# Patient Record
Sex: Female | Born: 1955 | Race: White | Hispanic: No | Marital: Married | Smoking: Never smoker
Health system: Southern US, Community
[De-identification: ages and names within clinical notes are randomized; demographics above are authoritative.]

## PROBLEM LIST (undated history)

## (undated) DIAGNOSIS — E785 Hyperlipidemia, unspecified: Secondary | ICD-10-CM

## (undated) DIAGNOSIS — I7300 Raynaud's syndrome without gangrene: Secondary | ICD-10-CM

## (undated) DIAGNOSIS — L930 Discoid lupus erythematosus: Secondary | ICD-10-CM

## (undated) DIAGNOSIS — T7840XA Allergy, unspecified, initial encounter: Secondary | ICD-10-CM

## (undated) DIAGNOSIS — K648 Other hemorrhoids: Secondary | ICD-10-CM

## (undated) DIAGNOSIS — C4402 Squamous cell carcinoma of skin of lip: Secondary | ICD-10-CM

## (undated) DIAGNOSIS — K529 Noninfective gastroenteritis and colitis, unspecified: Secondary | ICD-10-CM

## (undated) DIAGNOSIS — Z9889 Other specified postprocedural states: Secondary | ICD-10-CM

## (undated) DIAGNOSIS — M1990 Unspecified osteoarthritis, unspecified site: Secondary | ICD-10-CM

## (undated) DIAGNOSIS — R112 Nausea with vomiting, unspecified: Secondary | ICD-10-CM

## (undated) DIAGNOSIS — K219 Gastro-esophageal reflux disease without esophagitis: Secondary | ICD-10-CM

## (undated) DIAGNOSIS — E039 Hypothyroidism, unspecified: Secondary | ICD-10-CM

## (undated) DIAGNOSIS — I351 Nonrheumatic aortic (valve) insufficiency: Secondary | ICD-10-CM

## (undated) DIAGNOSIS — M51369 Other intervertebral disc degeneration, lumbar region without mention of lumbar back pain or lower extremity pain: Secondary | ICD-10-CM

## (undated) DIAGNOSIS — J309 Allergic rhinitis, unspecified: Secondary | ICD-10-CM

## (undated) DIAGNOSIS — D860 Sarcoidosis of lung: Secondary | ICD-10-CM

## (undated) DIAGNOSIS — M5136 Other intervertebral disc degeneration, lumbar region: Secondary | ICD-10-CM

## (undated) HISTORY — PX: MEDIASTINOSCOPY: SUR861

## (undated) HISTORY — DX: Other specified postprocedural states: R11.2

## (undated) HISTORY — DX: Allergy, unspecified, initial encounter: T78.40XA

## (undated) HISTORY — DX: Raynaud's syndrome without gangrene: I73.00

## (undated) HISTORY — DX: Other specified postprocedural states: Z98.890

## (undated) HISTORY — DX: Other hemorrhoids: K64.8

## (undated) HISTORY — DX: Other intervertebral disc degeneration, lumbar region without mention of lumbar back pain or lower extremity pain: M51.369

## (undated) HISTORY — PX: TONSILLECTOMY: SUR1361

## (undated) HISTORY — DX: Hypothyroidism, unspecified: E03.9

## (undated) HISTORY — DX: Hyperlipidemia, unspecified: E78.5

## (undated) HISTORY — DX: Noninfective gastroenteritis and colitis, unspecified: K52.9

## (undated) HISTORY — DX: Unspecified osteoarthritis, unspecified site: M19.90

## (undated) HISTORY — DX: Discoid lupus erythematosus: L93.0

## (undated) HISTORY — DX: Squamous cell carcinoma of skin of lip: C44.02

## (undated) HISTORY — DX: Other intervertebral disc degeneration, lumbar region: M51.36

## (undated) HISTORY — DX: Nonrheumatic aortic (valve) insufficiency: I35.1

## (undated) HISTORY — PX: TONSILLECTOMY AND ADENOIDECTOMY: SHX28

## (undated) HISTORY — DX: Allergic rhinitis, unspecified: J30.9

## (undated) HISTORY — DX: Gastro-esophageal reflux disease without esophagitis: K21.9

## (undated) HISTORY — DX: Sarcoidosis of lung: D86.0

## (undated) HISTORY — PX: UPPER GASTROINTESTINAL ENDOSCOPY: SHX188

---

## 2004-06-03 ENCOUNTER — Encounter: Payer: Self-pay | Admitting: Internal Medicine

## 2004-06-03 ENCOUNTER — Ambulatory Visit: Payer: Self-pay | Admitting: Internal Medicine

## 2004-06-11 ENCOUNTER — Encounter: Payer: Self-pay | Admitting: Internal Medicine

## 2004-07-03 ENCOUNTER — Ambulatory Visit: Payer: Self-pay | Admitting: Gastroenterology

## 2004-07-03 ENCOUNTER — Encounter: Payer: Self-pay | Admitting: Internal Medicine

## 2004-08-13 ENCOUNTER — Encounter: Payer: Self-pay | Admitting: Internal Medicine

## 2004-11-05 ENCOUNTER — Encounter: Payer: Self-pay | Admitting: Internal Medicine

## 2004-11-11 ENCOUNTER — Ambulatory Visit: Payer: Self-pay | Admitting: Gastroenterology

## 2004-11-11 ENCOUNTER — Encounter: Payer: Self-pay | Admitting: Internal Medicine

## 2004-11-25 ENCOUNTER — Encounter: Payer: Self-pay | Admitting: Internal Medicine

## 2005-03-24 ENCOUNTER — Ambulatory Visit: Payer: Self-pay | Admitting: Obstetrics and Gynecology

## 2005-04-13 HISTORY — PX: OTHER SURGICAL HISTORY: SHX169

## 2005-07-08 ENCOUNTER — Encounter: Payer: Self-pay | Admitting: Cardiovascular Disease

## 2005-07-08 ENCOUNTER — Encounter: Payer: Self-pay | Admitting: Family Medicine

## 2006-03-06 ENCOUNTER — Emergency Department: Payer: Self-pay | Admitting: Emergency Medicine

## 2006-04-02 ENCOUNTER — Emergency Department: Payer: Self-pay | Admitting: Emergency Medicine

## 2006-04-02 ENCOUNTER — Ambulatory Visit: Payer: Self-pay | Admitting: Obstetrics and Gynecology

## 2006-04-13 HISTORY — PX: OOPHORECTOMY: SHX86

## 2006-04-14 ENCOUNTER — Encounter: Payer: Self-pay | Admitting: Internal Medicine

## 2006-04-16 ENCOUNTER — Ambulatory Visit: Payer: Self-pay | Admitting: Internal Medicine

## 2006-04-16 ENCOUNTER — Encounter: Payer: Self-pay | Admitting: Internal Medicine

## 2006-05-26 ENCOUNTER — Ambulatory Visit: Payer: Self-pay | Admitting: Otolaryngology

## 2006-06-02 ENCOUNTER — Ambulatory Visit: Payer: Self-pay | Admitting: Gastroenterology

## 2006-06-02 ENCOUNTER — Encounter: Payer: Self-pay | Admitting: Internal Medicine

## 2006-06-08 ENCOUNTER — Ambulatory Visit: Payer: Self-pay | Admitting: Obstetrics and Gynecology

## 2006-06-29 ENCOUNTER — Ambulatory Visit: Payer: Self-pay | Admitting: Surgery

## 2006-08-23 ENCOUNTER — Encounter: Payer: Self-pay | Admitting: Cardiovascular Disease

## 2006-12-03 ENCOUNTER — Ambulatory Visit: Payer: Self-pay | Admitting: Obstetrics and Gynecology

## 2006-12-10 ENCOUNTER — Ambulatory Visit: Payer: Self-pay | Admitting: Obstetrics and Gynecology

## 2007-03-14 HISTORY — PX: US ECHOCARDIOGRAPHY: HXRAD669

## 2007-03-18 ENCOUNTER — Encounter: Payer: Self-pay | Admitting: Cardiovascular Disease

## 2007-12-13 LAB — HM MAMMOGRAPHY: HM Mammogram: NORMAL

## 2007-12-15 ENCOUNTER — Ambulatory Visit: Payer: Self-pay | Admitting: Obstetrics and Gynecology

## 2008-04-17 ENCOUNTER — Encounter: Payer: Self-pay | Admitting: Family Medicine

## 2008-04-17 ENCOUNTER — Encounter: Payer: Self-pay | Admitting: Cardiovascular Disease

## 2008-04-17 ENCOUNTER — Ambulatory Visit: Payer: Self-pay | Admitting: Obstetrics and Gynecology

## 2008-05-22 ENCOUNTER — Encounter: Payer: Self-pay | Admitting: Internal Medicine

## 2008-06-12 LAB — HM PAP SMEAR

## 2008-06-12 LAB — CONVERTED CEMR LAB: Pap Smear: NORMAL

## 2008-06-26 ENCOUNTER — Encounter: Payer: Self-pay | Admitting: Cardiovascular Disease

## 2008-06-28 ENCOUNTER — Emergency Department: Payer: Self-pay | Admitting: Emergency Medicine

## 2008-06-28 ENCOUNTER — Encounter: Payer: Self-pay | Admitting: Family Medicine

## 2008-06-29 ENCOUNTER — Ambulatory Visit: Payer: Self-pay | Admitting: Family Medicine

## 2008-06-29 DIAGNOSIS — I359 Nonrheumatic aortic valve disorder, unspecified: Secondary | ICD-10-CM | POA: Insufficient documentation

## 2008-06-29 DIAGNOSIS — R0609 Other forms of dyspnea: Secondary | ICD-10-CM | POA: Insufficient documentation

## 2008-06-29 DIAGNOSIS — R079 Chest pain, unspecified: Secondary | ICD-10-CM | POA: Insufficient documentation

## 2008-06-29 DIAGNOSIS — R5383 Other fatigue: Secondary | ICD-10-CM

## 2008-06-29 DIAGNOSIS — R0989 Other specified symptoms and signs involving the circulatory and respiratory systems: Secondary | ICD-10-CM

## 2008-06-29 DIAGNOSIS — E785 Hyperlipidemia, unspecified: Secondary | ICD-10-CM

## 2008-06-29 DIAGNOSIS — R5381 Other malaise: Secondary | ICD-10-CM | POA: Insufficient documentation

## 2008-07-02 DIAGNOSIS — I10 Essential (primary) hypertension: Secondary | ICD-10-CM | POA: Insufficient documentation

## 2008-07-02 DIAGNOSIS — J309 Allergic rhinitis, unspecified: Secondary | ICD-10-CM | POA: Insufficient documentation

## 2008-07-02 DIAGNOSIS — K219 Gastro-esophageal reflux disease without esophagitis: Secondary | ICD-10-CM | POA: Insufficient documentation

## 2008-07-04 LAB — CONVERTED CEMR LAB
ALT: 22 units/L (ref 0–35)
AST: 30 units/L (ref 0–37)
Albumin: 3.9 g/dL (ref 3.5–5.2)
HDL: 53.1 mg/dL (ref >39.00)
Sed Rate: 17 mm/hr (ref 0–22)
Total Bilirubin: 0.9 mg/dL (ref 0.3–1.2)
Triglycerides: 57 mg/dL (ref 0.0–149.0)

## 2008-07-05 ENCOUNTER — Encounter: Payer: Self-pay | Admitting: Cardiovascular Disease

## 2008-07-05 ENCOUNTER — Encounter: Payer: Self-pay | Admitting: Family Medicine

## 2008-07-09 ENCOUNTER — Encounter: Payer: Self-pay | Admitting: Family Medicine

## 2008-07-09 ENCOUNTER — Encounter: Payer: Self-pay | Admitting: Cardiovascular Disease

## 2008-07-20 ENCOUNTER — Encounter: Payer: Self-pay | Admitting: Cardiovascular Disease

## 2008-07-20 ENCOUNTER — Encounter: Payer: Self-pay | Admitting: Family Medicine

## 2008-07-31 ENCOUNTER — Ambulatory Visit: Payer: Self-pay | Admitting: Family Medicine

## 2008-07-31 DIAGNOSIS — L930 Discoid lupus erythematosus: Secondary | ICD-10-CM

## 2008-07-31 DIAGNOSIS — R209 Unspecified disturbances of skin sensation: Secondary | ICD-10-CM

## 2008-07-31 DIAGNOSIS — D869 Sarcoidosis, unspecified: Secondary | ICD-10-CM | POA: Insufficient documentation

## 2008-07-31 DIAGNOSIS — I7300 Raynaud's syndrome without gangrene: Secondary | ICD-10-CM

## 2008-07-31 LAB — CONVERTED CEMR LAB
CRP, High Sensitivity: <1 (ref 0.00–5.00)
Rhuematoid fact SerPl-aCnc: <20 intl units/mL (ref 0.0–20.0)

## 2008-08-02 LAB — CONVERTED CEMR LAB: Anti Nuclear Antibody(ANA): NEGATIVE

## 2008-08-07 ENCOUNTER — Telehealth: Payer: Self-pay | Admitting: Family Medicine

## 2008-08-31 ENCOUNTER — Encounter: Payer: Self-pay | Admitting: Family Medicine

## 2008-09-25 ENCOUNTER — Encounter: Payer: Self-pay | Admitting: Family Medicine

## 2008-11-23 ENCOUNTER — Encounter: Payer: Self-pay | Admitting: Cardiovascular Disease

## 2008-12-05 ENCOUNTER — Encounter: Payer: Self-pay | Admitting: Family Medicine

## 2008-12-25 ENCOUNTER — Ambulatory Visit: Payer: Self-pay | Admitting: Family Medicine

## 2009-01-03 LAB — CONVERTED CEMR LAB: Ferritin: 22.5 ng/mL (ref 10.0–291.0)

## 2009-02-19 ENCOUNTER — Ambulatory Visit: Payer: Self-pay | Admitting: Internal Medicine

## 2009-02-19 DIAGNOSIS — R197 Diarrhea, unspecified: Secondary | ICD-10-CM

## 2009-02-19 DIAGNOSIS — R109 Unspecified abdominal pain: Secondary | ICD-10-CM | POA: Insufficient documentation

## 2009-03-22 ENCOUNTER — Ambulatory Visit: Payer: Self-pay | Admitting: Internal Medicine

## 2009-03-22 LAB — HM COLONOSCOPY

## 2009-03-27 ENCOUNTER — Telehealth: Payer: Self-pay | Admitting: Internal Medicine

## 2009-03-27 DIAGNOSIS — K37 Unspecified appendicitis: Secondary | ICD-10-CM | POA: Insufficient documentation

## 2009-03-29 ENCOUNTER — Ambulatory Visit: Payer: Self-pay | Admitting: Cardiovascular Disease

## 2009-04-01 ENCOUNTER — Telehealth: Payer: Self-pay | Admitting: Internal Medicine

## 2009-04-01 HISTORY — PX: COLONOSCOPY W/ BIOPSIES: SHX1374

## 2009-04-02 ENCOUNTER — Encounter: Payer: Self-pay | Admitting: Internal Medicine

## 2009-04-03 ENCOUNTER — Telehealth: Payer: Self-pay | Admitting: Internal Medicine

## 2009-04-04 ENCOUNTER — Encounter: Payer: Self-pay | Admitting: Internal Medicine

## 2009-04-17 ENCOUNTER — Encounter: Payer: Self-pay | Admitting: Internal Medicine

## 2009-04-19 ENCOUNTER — Encounter: Payer: Self-pay | Admitting: Internal Medicine

## 2009-06-07 ENCOUNTER — Encounter: Payer: Self-pay | Admitting: Cardiovascular Disease

## 2009-06-14 ENCOUNTER — Encounter: Payer: Self-pay | Admitting: Cardiovascular Disease

## 2009-07-02 ENCOUNTER — Encounter: Payer: Self-pay | Admitting: Family Medicine

## 2009-07-22 ENCOUNTER — Encounter: Payer: Self-pay | Admitting: Family Medicine

## 2009-07-26 ENCOUNTER — Encounter: Payer: Self-pay | Admitting: Internal Medicine

## 2009-09-20 ENCOUNTER — Ambulatory Visit (HOSPITAL_COMMUNITY): Admission: RE | Admit: 2009-09-20 | Discharge: 2009-09-21 | Payer: Self-pay | Admitting: General Surgery

## 2009-09-20 ENCOUNTER — Encounter (INDEPENDENT_AMBULATORY_CARE_PROVIDER_SITE_OTHER): Payer: Self-pay | Admitting: General Surgery

## 2009-09-20 HISTORY — PX: APPENDECTOMY: SHX54

## 2009-11-12 ENCOUNTER — Encounter: Payer: Self-pay | Admitting: Family Medicine

## 2010-03-21 ENCOUNTER — Ambulatory Visit: Payer: Self-pay | Admitting: Family Medicine

## 2010-03-21 DIAGNOSIS — R519 Headache, unspecified: Secondary | ICD-10-CM | POA: Insufficient documentation

## 2010-03-21 DIAGNOSIS — R51 Headache: Secondary | ICD-10-CM

## 2010-04-29 ENCOUNTER — Encounter: Payer: Self-pay | Admitting: Family Medicine

## 2010-05-13 NOTE — Letter (Signed)
Summary: [facility]  [facility]   Imported By: Lanelle Bal 07/13/2009 09:09:35  _____________________________________________________________________  External Attachment:    Type:   Image     Comment:   External Document

## 2010-05-13 NOTE — Consult Note (Signed)
Summary: [facility] Surgery   Imported By: Lester [place] 05/16/2009 08:40:28  _____________________________________________________________________  External Attachment:    Type:   Image     Comment:   External Document

## 2010-05-13 NOTE — Letter (Signed)
Summary: Physician's handwritten notes/[facility]  Physician's handwritten notes/[facility]   Imported By: Sherian Rein 04/23/2009 13:19:46  _____________________________________________________________________  External Attachment:    Type:   Image     Comment:   External Document

## 2010-05-13 NOTE — Letter (Signed)
Summary: [facility]-Rheumatology  [facility]-Rheumatology   Imported By: Maryln Gottron 11/29/2009 09:57:19  _____________________________________________________________________  External Attachment:    Type:   Image     Comment:   External Document

## 2010-05-13 NOTE — Consult Note (Signed)
Summary: [facility]  [facility]   Imported By: Lanelle Bal 07/26/2009 14:16:22  _____________________________________________________________________  External Attachment:    Type:   Image     Comment:   External Document

## 2010-05-13 NOTE — Letter (Signed)
Summary: [facility] Surgery   Imported By: Sherian Rein 09/10/2009 07:53:07  _____________________________________________________________________  External Attachment:    Type:   Image     Comment:   External Document

## 2010-05-15 NOTE — Assessment & Plan Note (Signed)
Summary: NECK PAIN,HA/CLE   Vital Signs:  Patient profile:   55 year old female Height:      67.5 inches Weight:      134 pounds BMI:     20.75 Temp:     98.2 degrees F oral Pulse rate:   62 / minute Pulse rhythm:   regular BP sitting:   120 / 78  (left arm) Cuff size:   regular  Vitals Entered By: Benny Lennert CMA Duncan Dull) (March 21, 2010 12:24 PM)  History of Present Illness: Chief complaint Neck pain and headache  In last year... she has had headache intermitantly  In past few months neck more sore, increase in frequency of headaches. Has noted bumpy areas on back of head... more showing up in last year. Right upper neck tender to palpation  Back of head is sore constant.. if touch back of her head  she feels lightheaded, dizzy. Occ wakes her up at night.  When wakes up in AM back of head sore.  In last month has felt presssure at top of her head.   Some nausea when head sore.    Has history of neuropathy in legs seen at [facility]..felt possible autoimmune issue going on.. no change. Better if exercising. Saw Rheum also at [facility].. loads of tests done.. some autoimmune issue, but unknown. No weakness in legs/arms.  No vision chane.. but progressivly worsening glasses prescription at eye MD.   HAs follow up with rheum in January.   Has had DM screen, nml thyroid and,nml B12.   Has history of sarcoid.  Ibuprofen haelps some.   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 Take One By Mouth Daily As Directed 2)  Fish Oil 1000 Mg Caps (Omega-3 Fatty Acids) .... Take One By Mouth Once Daily 3)  Sm Coral Calcium 1000 (390 Ca) Mg Tabs (Coral Calcium) .... Take One By Mouth Once Daily 4)  Vitamin D 400 Unit Caps (Cholecalciferol) .... Take One By Mouth Once Daily 5)  Multivitamins  Tabs (Multiple Vitamin) .... Take One By Mouth Once Daily 6)  Zyrtec Allergy 10 Mg Tabs (Cetirizine Hcl) .... Once A Day 7)  Meloxicam 15 Mg Tabs (Meloxicam) .Marland Kitchen.. 1 Tab By Mouth Daily  Allergies (verified): No Known Drug Allergies  Past History:  Past medical, surgical, family and social histories (including risk factors) reviewed, and no changes noted (except as noted below).  Past Medical History: Reviewed history from 02/19/2009 and no changes required. Abnormal heart valve - Aortic insufficiency Allergic rhinitis GERD ? Hyperlipidemia Hypertension Pulmonary Sarcoid - nonactive, biopsy proven ? 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 Barretts Esophagus - on 1st, not second EGD  GYN = Madelin Headings Former Pt of Einar Crow Cards = Arnoldo Hooker  Past Surgical History: Reviewed history from 12/25/2008 and no changes required. Oophorectomy, L, 2008 Voicebox,  removal of mucoceles, 2007 Mediastinoscopy = dx of Sarcoid Tonsillectomy, Adenoidectomy  Echo, 12/08, Mild-mod MR, myxomatous mitral valve leaflet, mild-mod AI, normal EF 06/2008, stress echo Gwen Pounds), no evidence of ischemia 06/2008, normal 2010, f/u echo, Kowalkski eval. Stable.  Family History: Reviewed history from 02/19/2009 and no changes required. M, 72, CHF, A. Fib F, Prostate CA Family History High cholesterol Family History Hypertension Lung CA x 3 people in family (smokers) M, RA Aunt, BRCA 5B, 2s,  healthy Family History of Colon Polyps: mother, father Family History of Stomach Cancer: paternal grandmother  Social History: Reviewed history from 02/19/2009 and no changes required. 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 CV:  Denies chest pain or discomfort. Resp:  Denies shortness of breath. GI:  Denies abdominal pain. GU:  Denies dysuria.  Physical Exam  General:  Well-developed,well-nourished,in no acute distress; alert,appropriate and cooperative throughout examination Head:  posteroir scalp tender to palpation, bony raised areas of skull, small Eyes:  No corneal or conjunctival inflammation noted. EOMI. Perrla. Funduscopic exam benign, without hemorrhages, exudates or papilledema. Vision grossly normal. Ears:  External ear exam shows no significant lesions or deformities.  Otoscopic examination reveals clear canals, tympanic membranes are intact bilaterally without bulging, retraction, inflammation or discharge. Hearing is grossly normal bilaterally. Nose:  External nasal examination shows no deformity or inflammation. Nasal mucosa are pink and moist without lesions or exudates. Mouth:  Oral mucosa and oropharynx without lesions or exudates.  Teeth in good repair. Neck:  no carotid bruit or thyromegaly no cervical or supraclavicular lymphadenopathy  Pulses:  R and L posterior tibial pulses are full and equal bilaterally  Extremities:  No clubbing, cyanosis, edema, or deformity noted with normal full range of motion of all joints.   Neurologic:  No cranial nerve deficits noted. Station and gait are normal. Plantar reflexes are down-going bilaterally. DTRs are symmetrical throughout. Sensory, motor and coordinative functions appear intact. Skin:  Intact without suspicious lesions or  rashes Psych:  Cognition and judgment appear intact. Alert and cooperative with normal attention span and concentration. No apparent delusions, illusions, hallucinations   Impression & Recommendations:  Problem # 1:  HEADACHE (ICD-784.0) Complicated pt currently  undergoing eval for autoimmune diseased...positive tests bu tno specific diagnosis made yet per pt. No records to review.  Unclear source of headhaces, but no red flags.   Nodules on scalp appear to be bony... doubt these are new.  Scalp is tender as well as trapezius muscles.  She will start with  NSAID, heat and stretching of neck. Recommended folow up with her neurologistto discuss headaches. Her updated medication list for this problem includes:    Meloxicam 15 Mg Tabs (Meloxicam) .Marland Kitchen... 1 tab by mouth daily  Complete Medication List: 1)  Aviane 0.1-20 Mg-mcg Tabs (Levonorgestrel-ethinyl estrad) .... Take one by mouth daily as directed 2)  Fish Oil 1000 Mg Caps (Omega-3 fatty acids) .... Take one by mouth once daily 3)  Sm Coral Calcium 1000 (390 Ca) Mg Tabs (Coral calcium) .... Take one by mouth once daily 4)  Vitamin D 400 Unit Caps (Cholecalciferol) .... Take one by mouth once daily 5)  Multivitamins Tabs (Multiple vitamin) .... Take one by mouth once daily 6)  Zyrtec Allergy 10 Mg Tabs (Cetirizine hcl) .... Once a day 7)  Meloxicam 15 Mg Tabs (Meloxicam) .Marland Kitchen.. 1 tab  by mouth daily  Patient Instructions: 1)  Follow up with rheumatologist and neurologist. 2)  Heat and gentle stretching. 3)  Try meloxicam for pain daily, with food.  Prescriptions: MELOXICAM 15 MG TABS (MELOXICAM) 1 tab by mouth daily  #30 x 1   Entered and Authorized by:   Kerby Nora MD   Signed by:   Kerby Nora MD on 03/21/2010   Method used:   Electronically to        [id number] (retail)       [address]       [place], [place]  [address]       Ph: [phone]       Fax: [phone]   RxID:   [id number]    Orders  Added: 1)  Est. Patient Level III [95638]    Current Allergies (reviewed today): No known allergies

## 2010-05-21 NOTE — Procedures (Signed)
Summary: 24 Hour Holter Monitor   24 Hour Holter Monitor   Imported By: Roderic Ovens 05/16/2010 15:25:06  _____________________________________________________________________  External Attachment:    Type:   Image     Comment:   External Document

## 2010-05-21 NOTE — Letter (Signed)
Summary: [facility] Cardiology Recheck Note   [facility] Cardiology Recheck Note   Imported By: Roderic Ovens 05/16/2010 15:23:57  _____________________________________________________________________  External Attachment:    Type:   Image     Comment:   External Document

## 2010-05-21 NOTE — Letter (Signed)
Summary: [facility] Office Visit Note 2007-2008  [facility] Office Visit Note 2007-2008   Imported By: Roderic Ovens 05/16/2010 15:21:17  _____________________________________________________________________  External Attachment:    Type:   Image     Comment:   External Document

## 2010-05-21 NOTE — Letter (Signed)
Summary: [facility] Cardiology Recheck Note   [facility] Cardiology Recheck Note   Imported By: Roderic Ovens 05/16/2010 15:22:42  _____________________________________________________________________  External Attachment:    Type:   Image     Comment:   External Document

## 2010-05-21 NOTE — Letter (Signed)
Summary: Rheumatology/[facility]  R[facility]umatology/[facility]   Imported By: Lester [place] 05/12/2010 09:19:33  _____________________________________________________________________  External Attachment:    Type:   Image     Comment:   External Document

## 2010-05-21 NOTE — Letter (Signed)
Summary: [facility] Cardiology New Visit Note   [facility] Cardiology New Visit Note   Imported By: Roderic Ovens 05/16/2010 15:25:51  _____________________________________________________________________  External Attachment:    Type:   Image     Comment:   External Document

## 2010-05-21 NOTE — Letter (Signed)
Summary: [facility] Cardiology Recheck Note   [facility] Cardiology Recheck Note   Imported By: Roderic Ovens 05/16/2010 15:24:17  _____________________________________________________________________  External Attachment:    Type:   Image     Comment:   External Document

## 2010-05-21 NOTE — Letter (Signed)
Summary: [facility] Office Visit Note   [facility] Visit Note   Imported By: Roderic Ovens 05/16/2010 15:19:43  _____________________________________________________________________  External Attachment:    Type:   Image     Comment:   External Document

## 2010-05-27 ENCOUNTER — Encounter: Payer: Self-pay | Admitting: Cardiovascular Disease

## 2010-05-27 ENCOUNTER — Ambulatory Visit (INDEPENDENT_AMBULATORY_CARE_PROVIDER_SITE_OTHER): Payer: BC Managed Care – PPO | Admitting: Cardiovascular Disease

## 2010-05-27 DIAGNOSIS — R000 Tachycardia, unspecified: Secondary | ICD-10-CM

## 2010-05-27 DIAGNOSIS — I080 Rheumatic disorders of both mitral and aortic valves: Secondary | ICD-10-CM | POA: Insufficient documentation

## 2010-05-27 DIAGNOSIS — R0989 Other specified symptoms and signs involving the circulatory and respiratory systems: Secondary | ICD-10-CM | POA: Insufficient documentation

## 2010-05-27 DIAGNOSIS — E785 Hyperlipidemia, unspecified: Secondary | ICD-10-CM

## 2010-05-27 DIAGNOSIS — R002 Palpitations: Secondary | ICD-10-CM

## 2010-06-04 NOTE — Assessment & Plan Note (Signed)
Summary: BICUSPID VALVE WITH MITRIAL VALVE ARRYTHMIAS/ Former Engineer, manufacturing...   Visit Type:  Initial Consult Primary Provider:  Kerin Perna, MD  CC:  c/o SOB on occasion and pressure behind heart and left arm.Marland Kitchen  History of Present Illness: [name] is a very pleasant 55 year old woman, patient of Dr. Dallas Schimke, with a history of bicuspid aortic valve, mild to moderate aortic valve insufficiency, mild to moderate MR with normal LV systolic function who also has a history of palpitations, neuropathy in her lower extremities, Raynaud's disease, high cholesterol, hypertension who presents to establish care.  She reports that she has episodic palpitations once a month. It comes on is a very fast beating. The last time it happened, she was sleeping. She had 2 go to the emergency room as it was making her shake. This happens very infrequently.  She has chest tightness that comes on the left side of her chest and radiates to her arm. This happens periodically, sometimes at rest and sometimes with exertion. She is otherwise active, does require walking and running. She has had a stress echo in the past several years which was negative.  She does have a unspecified rheumatologic issue with leg swelling and numbness with elevated inflammatory markers. She has had workup at [facility] with positive nerve studies showing neuropathy in her lower extremities.  EKG shows normal sinus rhythm with rate 67 beats per minute no significant ST or T wave changes  Current Medications (verified): 1)  Aviane 0.1-20 Mg-Mcg Tabs (Levonorgestrel-Ethinyl Estrad) .... Take One By Mouth Daily As Directed 2)  Fish Oil 1000 Mg Caps (Omega-3 Fatty Acids) .... Take One By Mouth Once Daily 3)  Sm Coral Calcium 1000 (390 Ca) Mg Tabs (Coral Calcium) .... Take One By Mouth Once Daily 4)  Vitamin D 400 Unit Caps (Cholecalciferol) .... Take One By Mouth Once Daily 5)  Multivitamins  Tabs (Multiple Vitamin) .... Take One By Mouth Once  Daily 6)  Zyrtec Allergy 10 Mg Tabs (Cetirizine Hcl) .... Once A Day  Allergies (verified): No Known Drug Allergies  Past History:  Past Medical History: Last updated: 02/19/2009 Abnormal heart valve - Aortic insufficiency Allergic rhinitis GERD ? Hyperlipidemia Hypertension Pulmonary Sarcoid - nonactive, biopsy proven ? 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 Barretts Esophagus - on 1st, not second EGD  GYN = Madelin Headings Former Pt of [employer] Cards = Arnoldo Hooker  Past Surgical History: Last updated: 12/25/2008 Oophorectomy, L, 2008 Voicebox, removal of mucoceles, 2007 Mediastinoscopy = dx of Sarcoid Tonsillectomy, Adenoidectomy  Echo, 12/08, Mild-mod MR, myxomatous mitral valve leaflet, mild-mod AI, normal EF 06/2008, stress echo Gwen Pounds), no evidence of ischemia 06/2008, normal 2010, f/u echo, Kowalkski eval. Stable.  Family History: Last updated: 02/19/2009 M, 72, CHF, A. 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  palpitations, tachycardia, numbness in legs, SOB with heavy exertion  Vital Signs:  Patient profile:    55 year old female Height:      67.5 inches Weight:      133 pounds BMI:     20.60 Pulse rate:   67 / minute BP sitting:   157 / 98  (left arm) Cuff size:   regular  Vitals Entered By: Lysbeth Galas CMA (May 27, 2010 11:25 AM)  Physical Exam  General:  Well developed, well nourished, in no acute distress. Head:  normocephalic and atraumatic Neck:  Neck supple, no JVD. No masses, thyromegaly or abnormal cervical nodes. Lungs:  Clear bilaterally to auscultation and percussion. Heart:  Non-displaced PMI, chest non-tender; regular rate and rhythm, S1, S2 with I-II/VI SEM RSB, no rubs or gallops. Carotid upstroke normal, no bruit. Pedals normal pulses. No edema, no varicosities. Abdomen:  Bowel sounds positive; abdomen soft and non-tender without masses Msk:  Back normal, normal gait. Muscle strength and tone normal. Pulses:  pulses normal in all 4 extremities Extremities:  No clubbing or cyanosis. Neurologic:  Alert and oriented x 3. Skin:  Intact without lesions or rashes. Psych:  Normal affect.   Impression & Recommendations:  Problem # 1:  AORTIC & MITRAL STENOSIS W/ INSUFFI, RHEUM/NON-RHEUM (ICD-396.0) Notes indicate mild aortic stenosis, mild-to-moderate aortic valve regurgitation, mild to moderate MR. No dilation of the aortic root. Echo report suggest bicuspid aortic valve. This was not mentioned on most recent echocardiogram from Dr. Gwen Pounds ( uncertain if he missed this finding). She does have some mild shortness of breath with heavy exertion. She would like to repeat her echocardiogram given recent symptoms of palpitations, chest discomfort, shortness of breath.  Problem # 2:  CHEST PAIN (ICD-786.50) her chest pain does seem somewhat atypical. She has had workup in the past with echocardiography and stress testing. No further testing ordered at this time.  Orders: EKG w/ Interpretation (93000)  Problem # 3:  HYPERTENSION (ICD-401.9) Her blood pressure has been  well controlled on no medications. She was previously on Norvasc and then lisinopril. currently she does not take anything.  Problem # 4:  HYPERLIPIDEMIA (ICD-272.4) she does report a total cholesterol of 220. We'll try to obtain a copy of her most recent lipid panel for our records. We will discuss management of her cholesterol with her on her next visit. She does have a strong family history of coronary artery disease and stroke.  Problem # 5:  TACHYCARDIA (ICD-785) etiology of her palpitations and tachycardia is uncertain. If she continues to have episodes of tachycardia, we would order a Holter or event monitor. The symptoms/episodes have been rare. We could also try low-dose blocker such as propranolol or metoprolol tartrate.  Other Orders: Echocardiogram (Echo)  Patient Instructions: 1)  Your physician recommends that you schedule a follow-up appointment in: 1 year 2)  Your physician recommends that you continue on your current medications as directed. Please refer to the Current Medication list given to you today. 3)  Your physician has requested that you have an echocardiogram.  Echocardiography is a painless test that uses sound waves to create images of your heart. It provides your doctor with information about the size and shape of your heart and how well your heart's chambers and valves are working.  This procedure takes approximately one hour. There are no restrictions for this procedure.

## 2010-06-17 ENCOUNTER — Other Ambulatory Visit (INDEPENDENT_AMBULATORY_CARE_PROVIDER_SITE_OTHER): Payer: BC Managed Care – PPO

## 2010-06-17 ENCOUNTER — Other Ambulatory Visit: Payer: Self-pay | Admitting: Cardiovascular Disease

## 2010-06-17 DIAGNOSIS — R011 Cardiac murmur, unspecified: Secondary | ICD-10-CM

## 2010-06-17 DIAGNOSIS — I359 Nonrheumatic aortic valve disorder, unspecified: Secondary | ICD-10-CM

## 2010-06-20 ENCOUNTER — Ambulatory Visit: Payer: BC Managed Care – PPO | Admitting: Family Medicine

## 2010-06-24 NOTE — Letter (Signed)
Summary: [facility] Internal Medicine Procedure  [facility] Internal Medicine Procedure   Imported By: Roderic Ovens 06/19/2010 15:53:19  _____________________________________________________________________  External Attachment:    Type:   Image     Comment:   External Document

## 2010-06-24 NOTE — Letter (Signed)
Summary: [facility] Internal Medicine Procedure  [facility] Internal Medicine Procedure   Imported By: Roderic Ovens 06/19/2010 15:50:34  _____________________________________________________________________  External Attachment:    Type:   Image     Comment:   External Document

## 2010-06-24 NOTE — Letter (Signed)
Summary: [facility] Cardiology New Visit   [facility] Cardiology New Visit   Imported By: Roderic Ovens 06/19/2010 16:05:21  _____________________________________________________________________  External Attachment:    Type:   Image     Comment:   External Document

## 2010-06-24 NOTE — Procedures (Signed)
Summary: [facility] Cardiology Procedure  [facility] Cardiology Procedure   Imported By: Roderic Ovens 06/19/2010 16:04:16  _____________________________________________________________________  External Attachment:    Type:   Image     Comment:   External Document

## 2010-06-24 NOTE — Letter (Signed)
Summary: [facility] Cardiology [facility] Cardiology Recheck   Imported By: Roderic Ovens 06/19/2010 16:01:51  _____________________________________________________________________  External Attachment:    Type:   Image     Comment:   External Document

## 2010-06-24 NOTE — Letter (Signed)
Summary: [facility] Internal Medicine Procedure  [facility] Internal Medicine Procedure   Imported By: Roderic Ovens 06/19/2010 15:55:11  _____________________________________________________________________  External Attachment:    Type:   Image     Comment:   External Document

## 2010-06-30 LAB — CBC
HCT: 37.3 % (ref 36.0–46.0)
Hemoglobin: 13.2 g/dL (ref 12.0–15.0)
MCHC: 35.4 g/dL (ref 30.0–36.0)
MCV: 90 fL (ref 78.0–100.0)
Platelets: 184 10*3/uL (ref 150–400)
RDW: 12 % (ref 11.5–15.5)
WBC: 3.5 10*3/uL — ABNORMAL LOW (ref 4.0–10.5)

## 2010-06-30 LAB — DIFFERENTIAL
Basophils Relative: 1 % (ref 0–1)
Eosinophils Relative: 1 % (ref 0–5)
Monocytes Relative: 8 % (ref 3–12)

## 2010-06-30 LAB — BASIC METABOLIC PANEL
BUN: 6 mg/dL (ref 6–23)
Calcium: 9.2 mg/dL (ref 8.4–10.5)
Chloride: 105 mEq/L (ref 96–112)
Creatinine, Ser: 0.76 mg/dL (ref 0.4–1.2)
Glucose, Bld: 87 mg/dL (ref 70–99)

## 2010-07-01 ENCOUNTER — Telehealth: Payer: Self-pay | Admitting: Cardiovascular Disease

## 2010-07-01 NOTE — Telephone Encounter (Signed)
Patient: Stacy Matthews Note: All result statuses are Final unless otherwise noted.  Tests: (1) EC 2-D Echo (US2DE) ! ECHOCARDIOGR              Done             Transthoracic Echocardiogram              *[facility]*                       [address]                             [place], [place] [address]                                 [phone]           --------------------------------------------------------------------     Transthoracic Echocardiography            Patient:    Stacy Matthews, Stacy Matthews     MR #:       [id number]     Study Date: 06/17/2010     Gender:     F     Age:        55     Height:     172.7cm     Weight:     60.3kg     BSA:        1.38m 2     Pt. Status:     Room:            SONOGRAPHER  Missy Al-Rammal, RVT, RDCS      PERFORMING   [place], [place]      ORDERING     [place], [place]      REFERRING    [place]     cc:            --------------------------------------------------------------------     History:  PMH: Palpitations, dyspnea, and murmur. Risk factors:     Previous Echo exams at outside facility have suggested bicuspid     aortic valve, mild to moderate MR and AI.     Patient has a history of lupus and Raynaud's disease, and pulmonary     sarcoidosis. Lifelong nonsmoker. Hypertension. Dyslipidemia.            --------------------------------------------------------------------     Study Conclusions            - Left ventricle: The cavity size was normal. Wall thickness was       normal. Systolic function was vigorous. The estimated ejection       fraction was in the range of 65% to 70%. Wall motion was normal;       there were no regional wall motion abnormalities. Left ventricular       diastolic function parameters were normal.     - Aortic valve: Moderate regurgitation. Valve area: 2.55cm 2(VTI).       Valve area: 2.65cm 2 (Vmax).     - Mitral valve: Mild regurgitation. Valve area by pressure       half-time: 2.29cm 2.  Transthoracic echocardiography. M-mode, complete 2D, spectral     Doppler, and color Doppler. Height: Height: 172.7cm. Height: 68in.     Weight: Weight: 60.3kg. Weight: 132.7lb. Body mass index: BMI:     20.2kg/m 2. Body surface area: BSA: 1.77m 2. Blood pressure: 118/76.  Patient status: Outpatient. Location: [facility].            --------------------------------------------------------------------            --------------------------------------------------------------------     Left ventricle: The cavity size was normal. Wall thickness was     normal. Systolic function was vigorous. The estimated ejection     fraction was in the range of 65% to 70%. Wall motion was normal;     there were no regional wall motion abnormalities. The transmitral     flow pattern was normal. The deceleration time of the early     transmitral flow velocity was normal. The pulmonary vein flow     pattern was normal. The tissue Doppler parameters were normal. Left     ventricular diastolic function parameters were normal.            --------------------------------------------------------------------     Aortic valve: Probably trileaflet. Doppler: Moderate regurgitation.     VTI ratio of LVOT to aortic valve: 0.81. Valve area: 2.55cm 2(VTI).     Indexed valve area: 1.48cm 2/m 2 (VTI). Peak velocity ratio of LVOT     to aortic valve: 0.84. Valve area: 2.65cm 2 (Vmax). Indexed valve     area: 1.54cm 2/m 2 (Vmax).  Mean gradient: 5mm Hg (S). Peak     gradient: 10mm Hg (S).            --------------------------------------------------------------------     Aorta: Aortic root: The aortic root was normal in size.     Ascending aorta: The ascending aorta was normal in size.            --------------------------------------------------------------------     Mitral valve: Mildly thickened leaflets . Doppler: Mild     regurgitation.  Valve area by pressure half-time: 2.29cm 2. Indexed     valve area by  pressure half-time: 1.33cm 2/m 2.            --------------------------------------------------------------------     Left atrium: The atrium was normal in size.            --------------------------------------------------------------------     Right ventricle: The cavity size was normal. Wall thickness was     normal. Systolic function was normal.            --------------------------------------------------------------------     Pulmonic valve: Mildly thickened leaflets. Doppler: Mild     regurgitation.            --------------------------------------------------------------------     Tricuspid valve: Normal thickness leaflets. Doppler: Mild     regurgitation.            --------------------------------------------------------------------     Right atrium: The atrium was normal in size.            --------------------------------------------------------------------     Pericardium: The pericardium was normal in appearance.            --------------------------------------------------------------------     Systemic veins:     Inferior vena cava: The vessel was normal in size; the respirophasic     diameter changes were in the normal range (= 50%); findings are     consistent with normal central venous pressure.            --------------------------------------------------------------------            2D measurements            Normal  Doppler measurements       Normal     Left ventricle  LVOT     LVID ED,       45.7 mm     43-52   Peak vel, S   136 cm/s     ------     chord, PLAX                        VTI, S       25.7 cm       ------     LVID ES,       28.5 mm     23-38   Peak            7 mm Hg    ------     chord, PLAX                        gradient, S     FS, chord,       38 %      >29     Aortic valve     PLAX                               Peak vel, S   161 cm/s     ------     LVPW, ED       9.36 mm     ------  Mean vel, S   107 cm/s     ------      IVS/LVPW          1        <1.3    VTI, S       31.7 cm       ------     ratio, ED                          Mean            5 mm Hg    ------     Vol ED, MOD1     78 ml     ------  gradient, S     Vol ES, MOD1     22 ml     ------  Peak           10 mm Hg    ------     EF, MOD1         72 %      ------  gradient, S     Vol index, ED,   45 ml/m 2 ------  VTI ratio    0.81          ------     MOD1                               LVOT/AV     Vol index, ES,   13 ml/m 2 ------  Area, VTI    2.55 cm 2     ------     MOD1                               Area index   1.48 cm 2/m 2 ------     Vol ED, MOD2     67 ml     ------  (VTI)     Vol ES, MOD2  21 ml     ------  Peak vel     0.84          ------     EF, MOD2         69 %      ------  ratio,     Stroke vol,      46 ml     ------  LVOT/AV     MOD2                               Area, Vmax   2.65 cm 2     ------     Vol index, ED,   39 ml/m 2 ------  Area index   1.54 cm 2/m 2 ------     MOD2                               (Vmax)     Vol index, ES,   12 ml/m 2 ------  Regurg PHT    628 ms       ------     MOD2                               Mitral valve     Stroke index,  26.7 ml/m 2 ------  Peak E vel   67.1 cm/s     ------     MOD2                               Peak A vel     71 cm/s     ------     Ventricular septum                 Pressure       96 ms       ------     IVS, ED        9.36 mm     ------  half-time     LVOT                               Peak E/A      0.9          ------     Diam, S          20 mm     ------  ratio     Area           3.14 cm 2   ------  Area (PHT)   2.29 cm 2     ------     Aorta                              Area index   1.33 cm 2/m 2 ------     Root diam, ED    30 mm     ------  (PHT)     Left atrium                        Tricuspid valve     AP dim           31 mm     ------  Regurg peak   231 cm/s     ------     AP dim index    1.8 cm/m 2 <2.2    vel     Vol, S           35 ml     ------  Peak RV-RA     21 mm  Hg    ------     Vol index, S   20.3 ml/m 2 ------  gradient, S                                        Pulmonic valve                                        Peak vel, S   106 cm/s     ------                                        Regurg vel,  84.2 cm/s     ------                                        ED           --------------------------------------------------------------------     Prepared and Electronically Authenticated by            Cassell Clement, MD     [id number]:[id number]  Note: An exclamation mark (!) indicates a result that was not dispersed into the flowsheet. Document Creation Date: 06/17/2010 7:04 PM _______________________________________________________________________  (1) Order result status: Final Collection or observation date-time: 06/17/2010 12:13 Requested date-time:  Receipt date-time:  Reported date-time: 06/17/2010 18:39 Referring Physician:   Ordering Physician: Julien Nordmann [phone]) Specimen Source:  Source: Lillia Dallas Order Number: [id number] Lab site:    Signed by Dossie Arbour MD on 06/19/2010 at 12:19 AM  ________________________________________________________________________ Aortic valve regurg is moderate. I will look at pictures early next week   Signed by Dossie Arbour MD on 06/19/2010 at 12:20 AM  Signed by Dossie Arbour MD on 06/24/2010 at 9:03 PM ________________________________________________________________________ Attempted to contact pt with results, LMOM TCB /MES   Signed by Lanny Hurst RN on 06/19/2010 at 3:49 PM  ________________________________________________________________________ Notified pt of msg, she is also asking about her palpitations that come and go, she had mentioned to you before. She wants to know if this could be causing this, if not, what is causing them and what is the best option for treatment. Notified pt we would call her early next week. Pt wants to be reached by cell and can leave msg  [phone].   Signed by Lanny Hurst RN on 06/20/2010 at 8:42 AM  ________________________________________________________________________ would try low dose meteprolol tartrate 12.5 two times a day, titrating up to 25 two times a day if BP and heart rat tolerate. Use for palps.  valve regurg could be contributing to palps.  Can try medication as above   Signed by Dossie Arbour MD on 06/24/2010 at 9:04 PM  ________________________________________________________________________ Spoke to pt, notified of results and recommendations. Pt states on previous echo's it was said she had bicuspid aortic  valve, and this report did not state that, just wanted to confirm this. Also, pt states she believes she may have autoimmune disorder (not sure what type), this is something she has been researching and trying to diagnose; she is asking if any one type of autoimmune d/o would cause palpitations and if so, she would like to try to rule this out before trying metoprolol for palps. Also, do you want pt to f/u annually with echo?   Signed by Lanny Hurst RN on 06/25/2010 at 12:32 PM  Signed by Dossie Arbour MD on 06/29/2010 at 2:06 PM ________________________________________________________________________ Pt wanted me to call her cell and may leave vm if no answer, [phone].   Signed by Lanny Hurst RN on 06/25/2010 at 12:33 PM  ________________________________________________________________________ Looked at pictures. Aortic regurg is at least moderate. Would do echo every year. Looks trileaflet to me as well, though can not see 100% clearly Don't know of autoimmune and palpitation connection. Palpis likely from extra leak from aortic valve. Would take metoprolol is palps are bothersome   Signed by Dossie Arbour MD on 06/29/2010 at 2:03 PM

## 2010-07-07 NOTE — Telephone Encounter (Signed)
Attempted to contact pt, LMOM TCB.  

## 2010-07-08 NOTE — Telephone Encounter (Signed)
Spoke to pt, notified her that her EKG form 05/15/10 showed SR with Possible MI per Dr. Ladona Ridgel (in office this AM). Pt states she does not have h/o MI that she knows of, but she has noticed some chest pain recently. Scheduled pt for f/u with Dr. Mariah Milling 07/16/10, pt ok with this, she will call with any problems in the meantime.

## 2010-07-08 NOTE — Telephone Encounter (Signed)
Attempted to contact pt, LMOM TCB.  

## 2010-10-03 ENCOUNTER — Ambulatory Visit: Payer: Self-pay | Admitting: Otolaryngology

## 2010-10-28 ENCOUNTER — Telehealth: Payer: Self-pay | Admitting: Internal Medicine

## 2010-10-28 ENCOUNTER — Encounter: Payer: Self-pay | Admitting: Cardiovascular Disease

## 2010-10-28 ENCOUNTER — Encounter: Payer: Self-pay | Admitting: Internal Medicine

## 2010-10-28 NOTE — Telephone Encounter (Signed)
Left a message for patient to call me back.

## 2010-10-28 NOTE — Telephone Encounter (Signed)
Patient notified of Dr. Marvell Fuller recommendtions.

## 2010-10-28 NOTE — Telephone Encounter (Signed)
Left a message for patient to call me. 

## 2010-10-28 NOTE — Telephone Encounter (Signed)
Patient states she fell while on vacation and went to the ER for an xray that showed abundant stool in rectum. After the fall, she developed hemorrhoids. She states she can feel something when she wipes and feels "pressure there" and has pain. Denies bleeding. She is using metamucil and has normal bowel movements. She will try Miralax if she feels constipated. Instructed patient to try sitz baths TID, Tuck pads and to use baby wipes instead of toilet paper. Any other suggestions? Please advise.

## 2010-10-28 NOTE — Telephone Encounter (Signed)
Patient states she fell

## 2010-10-28 NOTE — Telephone Encounter (Signed)
We know she has hemorrhoids from colonoscopy also Would use prep H suppository twice a day for a week also Follow-up prn otherwise and can see PCP for this if needed or [place]

## 2010-10-31 ENCOUNTER — Encounter: Payer: Self-pay | Admitting: Family Medicine

## 2010-10-31 ENCOUNTER — Ambulatory Visit (INDEPENDENT_AMBULATORY_CARE_PROVIDER_SITE_OTHER): Payer: BC Managed Care – PPO | Admitting: Family Medicine

## 2010-10-31 VITALS — BP 124/72 | HR 68 | Temp 98.6°F | Wt 130.0 lb

## 2010-10-31 DIAGNOSIS — M545 Low back pain, unspecified: Secondary | ICD-10-CM

## 2010-10-31 MED ORDER — CYCLOBENZAPRINE HCL 5 MG PO TABS: 5.0000 mg | ORAL_TABLET | Freq: Two times a day (BID) | ORAL | Status: DC | PRN

## 2010-10-31 NOTE — Assessment & Plan Note (Addendum)
Lower sacral bony contusion vs coccygeal injury. Advised give more time, added flexeril for muscle spasm. RTC if not improving daily or any worsening to consider rpt xray. Provided with tailbone injury handout from [facility] pt advisor.

## 2010-10-31 NOTE — Progress Notes (Signed)
  Subjective:    Patient ID: Stacy Matthews, female    DOB: [birth year], 55 y.o.   MRN: [id number]  HPI CC: hurt back  Last week on vacation to [place], climbing waterfall, feet slipped and hit lower back on rock.  Bad pain.  Found cold water and sat in small puddle for 1 hour then got up.  Went to ER 2 days later and had xray, told no fractures just mild DDD.  Given vicodin for pain, hasn't really taken.  Also using advil 600mg  q6 hours.  No muscle relaxant.  Getting some better but taking its time.  H/o L5/S1 fx as youth.  Denies saddle anesthesia.  Denies fevers/chills, bowel/bladder accidents but endorses more constipation.  Also new hemorrhoid.  Taking miralax and plenty of water.  When lays on side, unable to pull knees up.  When laying on back worse pain.  Sitting leaning forward better.  Bending painful.    Advised to f/u in 1 wk if not better, so here today.  Review of Systems Per HPI    Objective:   Physical Exam  Nursing note and vitals reviewed. Constitutional: She appears well-developed and well-nourished. No distress.  HENT:  Head: Normocephalic and atraumatic.  Musculoskeletal:       Decreased ROM spine flexion/extension 2/2 pain. Neg SLR bilaterally, neg FABER, no SIJ pain. Tender midline and some paraspinous at lower sacral spine and upper coccyx.  Possible spasm.  Neurological: She has normal reflexes.       Slightly diminished DTRs throughout  Skin: Skin is warm and dry. No ecchymosis and no rash noted.  Psychiatric: She has a normal mood and affect.          Assessment & Plan:

## 2010-10-31 NOTE — Patient Instructions (Addendum)
I think this was bad bone contusion of spine along lower sacral region.   Treat with continued anti inflammatory and I have prescribed flexeril 5mg  (may make you sleepy). Stretching exercises. If any worsening or not improving over next few weeks, let us know I may recommend rpt xray.  For now, as things seem to be getting better, will monitor.

## 2010-12-09 ENCOUNTER — Ambulatory Visit (INDEPENDENT_AMBULATORY_CARE_PROVIDER_SITE_OTHER): Payer: BC Managed Care – PPO | Admitting: Internal Medicine

## 2010-12-09 ENCOUNTER — Encounter: Payer: Self-pay | Admitting: Internal Medicine

## 2010-12-09 VITALS — BP 110/68 | HR 76 | Ht 68.0 in | Wt 127.6 lb

## 2010-12-09 DIAGNOSIS — K649 Unspecified hemorrhoids: Secondary | ICD-10-CM

## 2010-12-09 DIAGNOSIS — K5900 Constipation, unspecified: Secondary | ICD-10-CM

## 2010-12-09 DIAGNOSIS — R1033 Periumbilical pain: Secondary | ICD-10-CM

## 2010-12-09 DIAGNOSIS — K219 Gastro-esophageal reflux disease without esophagitis: Secondary | ICD-10-CM

## 2010-12-09 DIAGNOSIS — K648 Other hemorrhoids: Secondary | ICD-10-CM

## 2010-12-09 MED ORDER — BENEFIBER PO POWD: ORAL | Status: DC

## 2010-12-09 MED ORDER — DICYCLOMINE HCL 10 MG PO CAPS: 10.0000 mg | ORAL_CAPSULE | Freq: Four times a day (QID) | ORAL | Status: DC

## 2010-12-09 NOTE — Patient Instructions (Addendum)
Take Benefiber 1 tsp in 6-8 oz of liquid daily may increase up to 2 tsp daily if needed. Your prescription(s) has(have) been sent to your pharmacy for you to pick up (Bentyl). Incerase your fluid intake if this doesn't help you may try a daily dose of Miralax. Come back to see Dr. Leone Payor as needed. Hemorrhoid and High fiber diet handout was given for you to read.

## 2010-12-10 ENCOUNTER — Encounter: Payer: Self-pay | Admitting: Internal Medicine

## 2010-12-10 DIAGNOSIS — K5900 Constipation, unspecified: Secondary | ICD-10-CM | POA: Insufficient documentation

## 2010-12-10 DIAGNOSIS — K648 Other hemorrhoids: Secondary | ICD-10-CM | POA: Insufficient documentation

## 2010-12-10 DIAGNOSIS — R1033 Periumbilical pain: Secondary | ICD-10-CM | POA: Insufficient documentation

## 2010-12-10 NOTE — Assessment & Plan Note (Signed)
As could be IBS. It can possibly related to her autoimmune disorder and even sarcoid that she says that's inactive. At this point clinically worrisome features and she will try dicyclomine therapy. If she moves her bowels better she may also have less pain.

## 2010-12-10 NOTE — Assessment & Plan Note (Addendum)
She may not actually have this. She has an intermittent cough and some atypical symptoms but has other issues that may be related. She swears that chronic course of PPI made no difference in the last year or 2 She also had a z-line bx in 2006 showing intestinal metaplasia but 2008 EGD did not confirm

## 2010-12-10 NOTE — Assessment & Plan Note (Addendum)
I think she is a component of IBS. If not better with fiber and possible MiraLax consider more aggressive therapy versus evaluation

## 2010-12-10 NOTE — Progress Notes (Signed)
  Subjective:    Patient ID: Stacy Matthews, female    DOB: [birth year], 55 y.o.   MRN: [id number]  HPI This lady returns to the clinic for evaluation of good problems or suspected to have problems. She's had a chronic intermittent cough for years it never did really respond to GI therapy. She recently saw her ENT physician and had some laryngeal nodules detected on fiberoptic laryngoscopy. He thought that they were probably due to her sarcoidosis versus other autoimmune problem. You have an MRI to evaluate dizziness neuropathy in what he called poorly defined illness. He thought she might have MS.  She has a variety of complaints but today she is discussing intermittent periumbilical abdominal pain and some crampy lower quadrant abdominal pain. She's not sure what triggers this. Her bowels are moving infrequently and she has to strain to defecate at times.. She hasn't integrative medicine physician who has drawn a battery of studies including some stool studies are not back yet. She stopped gluten but felt worse and she notes she has been tested for celiac disease and that it was negative. She has not had any significant unintentional weight loss. She has some sort of vague autoimmune disorder that they cannot label, she has been seen at [facility] regarding this.  She is also describing a protrusion in the anorectal area that she thinks might be a hemorrhoid. The bleeding  Some of his abdominal symptoms are really chronic and recurrent. When I had seen her last year and the year before she had a possible abnormality the appendix which was eventually removed.   Review of Systems As per HPI    Objective:   Physical Exam Thin well-developed well-nourished female in no acute distress And posterior pharynx are clear as is the oral cavity The lungs are clear to auscultation Heart S1-S2 regular no rubs murmurs or gallops at this point The abdomen is thin soft and nontender without organomegaly mass Rectal  exam performed in the presence of female staff shows a small grade 3 prolapsing hemorrhoid that I can reduce. She notes that she labeled it as well. Rectal exam reveals minimal brown stool and no mass. There is no rectocele She is awake alert and oriented x3 and has appropriate mood and affect overall.       Assessment & Plan:

## 2012-09-23 ENCOUNTER — Ambulatory Visit: Payer: Self-pay | Admitting: Obstetrics and Gynecology

## 2014-03-26 ENCOUNTER — Encounter: Payer: Self-pay | Admitting: Internal Medicine

## 2015-03-01 ENCOUNTER — Other Ambulatory Visit: Payer: Self-pay | Admitting: Student

## 2015-03-01 DIAGNOSIS — E8801 Alpha-1-antitrypsin deficiency: Secondary | ICD-10-CM

## 2015-03-08 ENCOUNTER — Ambulatory Visit
Admission: RE | Admit: 2015-03-08 | Discharge: 2015-03-08 | Disposition: A | Discharge status: Home / Self Care | Payer: 59 | Source: Ambulatory Visit | Attending: Student | Admitting: Student

## 2015-03-08 DIAGNOSIS — E8801 Alpha-1-antitrypsin deficiency: Secondary | ICD-10-CM | POA: Diagnosis present

## 2015-03-25 ENCOUNTER — Encounter: Payer: Self-pay | Admitting: *Deleted

## 2015-03-25 ENCOUNTER — Ambulatory Visit: Payer: 59 | Admitting: Anesthesiology

## 2015-03-25 ENCOUNTER — Ambulatory Visit
Admission: RE | Admit: 2015-03-25 | Discharge: 2015-03-25 | Disposition: A | Discharge status: Home / Self Care | Payer: 59 | Source: Ambulatory Visit | Attending: Gastroenterology | Admitting: Gastroenterology

## 2015-03-25 ENCOUNTER — Encounter
Admission: RE | Disposition: A | Discharge status: Home / Self Care | Payer: Self-pay | Source: Ambulatory Visit | Attending: Gastroenterology

## 2015-03-25 DIAGNOSIS — Z7951 Long term (current) use of inhaled steroids: Secondary | ICD-10-CM | POA: Insufficient documentation

## 2015-03-25 DIAGNOSIS — E785 Hyperlipidemia, unspecified: Secondary | ICD-10-CM | POA: Diagnosis not present

## 2015-03-25 DIAGNOSIS — Z79899 Other long term (current) drug therapy: Secondary | ICD-10-CM | POA: Insufficient documentation

## 2015-03-25 DIAGNOSIS — K210 Gastro-esophageal reflux disease with esophagitis: Secondary | ICD-10-CM | POA: Insufficient documentation

## 2015-03-25 DIAGNOSIS — Z800 Family history of malignant neoplasm of digestive organs: Secondary | ICD-10-CM | POA: Diagnosis not present

## 2015-03-25 DIAGNOSIS — Z85819 Personal history of malignant neoplasm of unspecified site of lip, oral cavity, and pharynx: Secondary | ICD-10-CM | POA: Insufficient documentation

## 2015-03-25 DIAGNOSIS — K2970 Gastritis, unspecified, without bleeding: Secondary | ICD-10-CM | POA: Diagnosis not present

## 2015-03-25 DIAGNOSIS — R1310 Dysphagia, unspecified: Secondary | ICD-10-CM | POA: Insufficient documentation

## 2015-03-25 DIAGNOSIS — I351 Nonrheumatic aortic (valve) insufficiency: Secondary | ICD-10-CM | POA: Diagnosis not present

## 2015-03-25 DIAGNOSIS — I7300 Raynaud's syndrome without gangrene: Secondary | ICD-10-CM | POA: Insufficient documentation

## 2015-03-25 DIAGNOSIS — J45909 Unspecified asthma, uncomplicated: Secondary | ICD-10-CM | POA: Insufficient documentation

## 2015-03-25 HISTORY — PX: ESOPHAGOGASTRODUODENOSCOPY (EGD) WITH PROPOFOL: SHX5813

## 2015-03-25 SURGERY — ESOPHAGOGASTRODUODENOSCOPY (EGD) WITH PROPOFOL
Anesthesia: General

## 2015-03-25 MED ORDER — PROPOFOL 10 MG/ML IV BOLUS
INTRAVENOUS | Status: DC | PRN
  Administered 2015-03-25 (×3): 20 mg via INTRAVENOUS
  Administered 2015-03-25: 60 mg via INTRAVENOUS

## 2015-03-25 MED ORDER — SODIUM CHLORIDE 0.9 % IV SOLN
INTRAVENOUS | Status: DC
  Administered 2015-03-25: 11:00:00 via INTRAVENOUS

## 2015-03-25 MED ORDER — FENTANYL CITRATE (PF) 100 MCG/2ML IJ SOLN
INTRAMUSCULAR | Status: DC | PRN
  Administered 2015-03-25 (×4): 25 ug via INTRAVENOUS

## 2015-03-25 MED ORDER — MIDAZOLAM HCL 2 MG/2ML IJ SOLN
INTRAMUSCULAR | Status: DC | PRN
  Administered 2015-03-25: 2 mg via INTRAVENOUS

## 2015-03-25 NOTE — Op Note (Signed)
[facility] Gastroenterology Patient Name: Stacy Matthews Procedure Date: 03/25/2015 11:17 AM MRN: [id number] Account #: [id number] Date of Birth: 1955/04/27 Admit Type: Outpatient Age: 59 Room: [facility] ENDO ROOM 2 Gender: Female Note Status: Finalized Procedure:         Upper GI endoscopy Indications:       Dysphagia Patient Profile:   This is a 59 year old female. Providers:         Gerrit Heck. Rayann Heman, MD Referring MD:      Rusty Aus, MD (Referring MD) Medicines:         Propofol per Anesthesia Complications:     No immediate complications. Procedure:         Pre-Anesthesia Assessment:                    - Prior to the procedure, a History and Physical was                     performed, and patient medications, allergies and                     sensitivities were reviewed. The patient's tolerance of                     previous anesthesia was reviewed.                    After obtaining informed consent, the endoscope was passed                     under direct vision. Throughout the procedure, the                     patient's blood pressure, pulse, and oxygen saturations                     were monitored continuously. The Endoscope was introduced                     through the mouth, and advanced to the second part of                     duodenum. The upper GI endoscopy was accomplished without                     difficulty. The patient tolerated the procedure well. Findings:      The Z-line was irregular and was found 40 cm from the incisors. Biopsies       were taken with a cold forceps for histology.      Biopsies were taken with a cold forceps in the middle third of the       esophagus for histology.      Localized mild inflammation characterized by erythema was found in the       gastric body.      The examined duodenum was normal. Impression:        - Z-line irregular, 40 cm from the incisors. Biopsied.                    - Gastritis.         - Normal examined duodenum.                    - Biopsies were taken with a cold forceps for histology in  the middle third of the esophagus. Recommendation:    - Observe patient in GI recovery unit.                    - Resume regular diet.                    - Continue present medications.                    - Obtain speech and swallow eval for likely oropharyngeal                     dysphagia                    - Await pathology results.                    - The findings and recommendations were discussed with the                     patient.                    - The findings and recommendations were discussed with the                     patient's family. Procedure Code(s): --- Professional ---                    [phone], Esophagogastroduodenoscopy, flexible, transoral;                     with biopsy, single or multiple Diagnosis Code(s): --- Professional ---                    K22.8, Other specified diseases of esophagus                    K29.70, Gastritis, unspecified, without bleeding                    R13.10, Dysphagia, unspecified CPT copyright 2014 [employer] Medical [employer]. All rights reserved. The codes documented in this report are preliminary and upon coder review may  be revised to meet current compliance requirements. Mellody Life, MD 03/25/2015 11:38:15 AM This report has been signed electronically. Number of Addenda: 0 Note Initiated On: 03/25/2015 11:17 AM      [facility]

## 2015-03-25 NOTE — Discharge Instructions (Signed)

## 2015-03-25 NOTE — H&P (Signed)
Primary Care Physician:  Rusty Aus., MD  Pre-Procedure History & Physical: HPI:  Stacy Matthews is a 59 y.o. female is here for an endoscopy.   Past Medical History  Diagnosis Date  . Aortic insufficiency     abnormal heart valve  . AR (allergic rhinitis)   . HLD (hyperlipidemia)   . Pulmonary sarcoidosis (HCC)     nonactive, biopsy proven   . Discoid lupus     biopsy proven   . Raynaud's phenomenon   . 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 Colitis   . Internal hemorrhoids     Past Surgical History  Procedure Laterality Date  . Colonoscopy w/ biopsies  04/01/2009    internal hemorrhoids, non-specific inflammation near appendix  . Oophorectomy  2008    L  . Removal of mucoceles  2007  . Mediastinoscopy      dx of sarcoid   . Tonsillectomy and adenoidectomy    . US echocardiography  12/08    mild-mod MR, myxomatous mitral vlave leaflet, mild-mod AI, normal EF   . Upper gastrointestinal endoscopy  06/2004, 05/2006    Barrett's on 2006 EGd but not seen/confirmed 2008  . Appendectomy  09-20-09    chronic appendicitis  . Tonsillectomy      Prior to Admission medications   Medication Sig Start Date End Date Taking? Authorizing Provider  albuterol (PROVENTIL HFA;VENTOLIN HFA) 108 (90 BASE) MCG/ACT inhaler Inhale into the lungs every 6 (six) hours as needed for wheezing or shortness of breath.   Yes Historical Provider, MD  budesonide (PULMICORT) 180 MCG/ACT inhaler Inhale 1 puff into the lungs daily.   Yes Historical Provider, MD  Multiple Vitamin (MULTIVITAMIN) tablet Take 1 tablet by mouth daily.     Yes Historical Provider, MD  CALCIUM-VITAMIN D PO Take 1 capsule by mouth daily.      Historical Provider, MD  cetirizine (ZYRTEC) 10 MG tablet Take 10 mg by mouth daily.      Historical Provider, MD  Cholecalciferol (VITAMIN D) 400 UNITS capsule Take 400 Units by mouth daily.      Historical Provider, MD  dicyclomine (BENTYL) 10 MG capsule Take 1 capsule  (10 mg total) by mouth 4 (four) times daily. As needed for abdominal pain 12/09/10 12/09/11  Gatha Mayer, MD  Omega-3 Fatty Acids (FISH OIL) 1000 MG CAPS Take 1 capsule by mouth daily.      Historical Provider, MD  Wheat Dextrin (BENEFIBER) POWD Take 1 tsp of Benefiber in 6-8 oz of liquid daily Patient not taking: Reported on 03/25/2015 12/09/10   Gatha Mayer, MD    Allergies as of 03/14/2015  . (No Known Allergies)    Family History  Problem Relation Age of Onset  . Hypertension      family hx; also of high cholesterol   . Lung cancer Paternal Uncle     x 2  . BRCA 1/2      aunt   . Stomach cancer Paternal Grandmother   . Heart failure Mother   . Atrial fibrillation Mother   . Prostate cancer Father   . Hyperlipidemia      Family history  . Rheum arthritis Mother   . Colon polyps Mother   . Colon polyps Father   . Healthy Brother     x 5  . Healthy Sister     x 2  . Lung cancer Cousin     paternal  .  Colon cancer Neg Hx   . Diabetes Maternal Grandmother   . Diabetes Paternal Grandmother     Social History   Social History  . Marital Status: Married    Spouse Name: N/A  . Number of Children: 3  . Years of Education: N/A   Occupational History  . DENTAL HYGIENIST    Social History Main Topics  . Smoking status: Never Smoker   . Smokeless tobacco: Never Used  . Alcohol Use: No     Comment: Occasional  . Drug Use: No  . Sexual Activity: Not on file   Other Topics Concern  . Not on file   Social History Narrative   Married, 3 children.    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 May leave msg on cell     Physical Exam: BP 125/76 mmHg  Pulse 60  Temp(Src) 97.1 F (36.2 C) (Tympanic)  Resp 18  Ht '5\' 8"'  (1.727 m)  Wt 57.607 kg (127 lb)  BMI 19.31 kg/m2  SpO2 100% General:   Alert,  pleasant and cooperative in NAD Head:  Normocephalic  and atraumatic. Neck:  Supple; no masses or thyromegaly. Lungs:  Clear throughout to auscultation.    Heart:  Regular rate and rhythm. Abdomen:  Soft, nontender and nondistended. Normal bowel sounds, without guarding, and without rebound.   Neurologic:  Alert and  oriented x4;  grossly normal neurologically.  Impression/Plan: ROGAN ECKLUND is here for an endoscopy to be performed for dysphagia  Risks, benefits, limitations, and alternatives regarding  endoscopy have been reviewed with the patient.  Questions have been answered.  All parties agreeable.   Josefine Class, MD  03/25/2015, 11:16 AM

## 2015-03-25 NOTE — Anesthesia Postprocedure Evaluation (Signed)
Anesthesia Post Note  Patient: Stacy Matthews  Procedure(s) Performed: Procedure(s) (LRB): ESOPHAGOGASTRODUODENOSCOPY (EGD) WITH PROPOFOL (N/A)  Patient location during evaluation: PACU Anesthesia Type: General Level of consciousness: awake and alert Pain management: satisfactory to patient Vital Signs Assessment: post-procedure vital signs reviewed and stable Respiratory status: spontaneous breathing Cardiovascular status: stable Anesthetic complications: no    Last Vitals:  Filed Vitals:   03/25/15 1158 03/25/15 1208  BP: 122/78 128/80  Pulse: 53 54  Temp:    Resp: 13 15    Last Pain: There were no vitals filed for this visit.               VAN STAVEREN,Cherilynn Schomburg

## 2015-03-25 NOTE — Transfer of Care (Signed)
Immediate Anesthesia Transfer of Care Note  Patient: Stacy Matthews  Procedure(s) Performed: Procedure(s): ESOPHAGOGASTRODUODENOSCOPY (EGD) WITH PROPOFOL (N/A)  Patient Location: PACU  Anesthesia Type:General  Level of Consciousness: sedated  Airway & Oxygen Therapy: Patient Spontanous Breathing and Patient connected to nasal cannula oxygen  Post-op Assessment: Report given to RN and Post -op Vital signs reviewed and stable  Post vital signs: Reviewed and stable  Last Vitals:  Filed Vitals:   03/25/15 1042  BP: 125/76  Pulse: 60  Temp: 36.2 C  Resp: 18    Complications: No apparent anesthesia complications

## 2015-03-25 NOTE — Anesthesia Preprocedure Evaluation (Signed)
Anesthesia Evaluation  Patient identified by MRN, date of birth, ID band Patient awake    Reviewed: Allergy & Precautions, Patient's Chart, lab work & pertinent test results  Airway Mallampati: I       Dental  (+) Teeth Intact   Pulmonary asthma ,    breath sounds clear to auscultation       Cardiovascular negative cardio ROS   Rhythm:Regular     Neuro/Psych negative neurological ROS  negative psych ROS   GI/Hepatic Neg liver ROS, GERD  ,  Endo/Other  negative endocrine ROS  Renal/GU negative Renal ROS     Musculoskeletal negative musculoskeletal ROS (+)   Abdominal Normal abdominal exam  (+)   Peds  Hematology negative hematology ROS (+)   Anesthesia Other Findings   Reproductive/Obstetrics                             Anesthesia Physical Anesthesia Plan  ASA: II  Anesthesia Plan: General   Post-op Pain Management:    Induction: Intravenous  Airway Management Planned: Nasal Cannula  Additional Equipment:   Intra-op Plan:   Post-operative Plan:   Informed Consent: I have reviewed the patients History and Physical, chart, labs and discussed the procedure including the risks, benefits and alternatives for the proposed anesthesia with the patient or authorized representative who has indicated his/her understanding and acceptance.     Plan Discussed with: CRNA  Anesthesia Plan Comments:         Anesthesia Quick Evaluation

## 2015-03-26 ENCOUNTER — Other Ambulatory Visit: Payer: Self-pay | Admitting: Gastroenterology

## 2015-03-26 DIAGNOSIS — R1310 Dysphagia, unspecified: Secondary | ICD-10-CM

## 2015-03-26 LAB — SURGICAL PATHOLOGY

## 2015-03-27 ENCOUNTER — Encounter: Payer: Self-pay | Admitting: Gastroenterology

## 2015-04-19 ENCOUNTER — Other Ambulatory Visit: Payer: Self-pay | Admitting: Otolaryngology

## 2015-04-19 DIAGNOSIS — R221 Localized swelling, mass and lump, neck: Secondary | ICD-10-CM

## 2015-05-10 ENCOUNTER — Ambulatory Visit
Admission: RE | Admit: 2015-05-10 | Discharge: 2015-05-10 | Disposition: A | Discharge status: Home / Self Care | Payer: 59 | Source: Ambulatory Visit | Attending: Otolaryngology | Admitting: Otolaryngology

## 2015-07-05 ENCOUNTER — Other Ambulatory Visit: Payer: Self-pay | Admitting: Rheumatology

## 2015-07-05 DIAGNOSIS — M47812 Spondylosis without myelopathy or radiculopathy, cervical region: Secondary | ICD-10-CM

## 2015-07-12 ENCOUNTER — Ambulatory Visit
Admission: RE | Admit: 2015-07-12 | Discharge: 2015-07-12 | Disposition: A | Discharge status: Home / Self Care | Payer: 59 | Source: Ambulatory Visit | Attending: Rheumatology | Admitting: Rheumatology

## 2015-07-12 DIAGNOSIS — M47812 Spondylosis without myelopathy or radiculopathy, cervical region: Secondary | ICD-10-CM

## 2015-07-13 ENCOUNTER — Other Ambulatory Visit: Payer: 59

## 2015-08-30 ENCOUNTER — Other Ambulatory Visit: Payer: Self-pay | Admitting: Internal Medicine

## 2015-08-30 DIAGNOSIS — E041 Nontoxic single thyroid nodule: Secondary | ICD-10-CM

## 2015-09-05 ENCOUNTER — Ambulatory Visit
Admission: RE | Admit: 2015-09-05 | Discharge: 2015-09-05 | Disposition: A | Discharge status: Home / Self Care | Payer: 59 | Source: Ambulatory Visit | Attending: Internal Medicine | Admitting: Internal Medicine

## 2015-09-05 DIAGNOSIS — E041 Nontoxic single thyroid nodule: Secondary | ICD-10-CM

## 2015-11-12 ENCOUNTER — Other Ambulatory Visit: Payer: Self-pay | Admitting: Internal Medicine

## 2015-11-12 DIAGNOSIS — E041 Nontoxic single thyroid nodule: Secondary | ICD-10-CM

## 2016-04-23 DIAGNOSIS — E559 Vitamin D deficiency, unspecified: Secondary | ICD-10-CM | POA: Diagnosis not present

## 2016-04-23 DIAGNOSIS — R1310 Dysphagia, unspecified: Secondary | ICD-10-CM | POA: Diagnosis not present

## 2016-04-23 DIAGNOSIS — K219 Gastro-esophageal reflux disease without esophagitis: Secondary | ICD-10-CM | POA: Diagnosis not present

## 2016-05-01 DIAGNOSIS — Z85828 Personal history of other malignant neoplasm of skin: Secondary | ICD-10-CM | POA: Diagnosis not present

## 2016-05-08 DIAGNOSIS — J40 Bronchitis, not specified as acute or chronic: Secondary | ICD-10-CM | POA: Diagnosis not present

## 2016-06-19 DIAGNOSIS — Z1231 Encounter for screening mammogram for malignant neoplasm of breast: Secondary | ICD-10-CM | POA: Diagnosis not present

## 2016-06-19 DIAGNOSIS — Z6820 Body mass index (BMI) 20.0-20.9, adult: Secondary | ICD-10-CM | POA: Diagnosis not present

## 2016-06-19 DIAGNOSIS — N952 Postmenopausal atrophic vaginitis: Secondary | ICD-10-CM | POA: Diagnosis not present

## 2016-06-19 DIAGNOSIS — B354 Tinea corporis: Secondary | ICD-10-CM | POA: Diagnosis not present

## 2016-06-19 DIAGNOSIS — Z01419 Encounter for gynecological examination (general) (routine) without abnormal findings: Secondary | ICD-10-CM | POA: Diagnosis not present

## 2016-08-06 DIAGNOSIS — E039 Hypothyroidism, unspecified: Secondary | ICD-10-CM | POA: Diagnosis not present

## 2016-08-06 DIAGNOSIS — E559 Vitamin D deficiency, unspecified: Secondary | ICD-10-CM | POA: Diagnosis not present

## 2016-08-13 DIAGNOSIS — M359 Systemic involvement of connective tissue, unspecified: Secondary | ICD-10-CM | POA: Diagnosis not present

## 2016-08-13 DIAGNOSIS — I7300 Raynaud's syndrome without gangrene: Secondary | ICD-10-CM | POA: Diagnosis not present

## 2016-08-14 DIAGNOSIS — E039 Hypothyroidism, unspecified: Secondary | ICD-10-CM | POA: Diagnosis not present

## 2016-08-21 DIAGNOSIS — M25562 Pain in left knee: Secondary | ICD-10-CM | POA: Diagnosis not present

## 2016-08-27 DIAGNOSIS — S8012XA Contusion of left lower leg, initial encounter: Secondary | ICD-10-CM | POA: Diagnosis not present

## 2016-08-27 DIAGNOSIS — M25562 Pain in left knee: Secondary | ICD-10-CM | POA: Diagnosis not present

## 2016-08-27 DIAGNOSIS — S8002XA Contusion of left knee, initial encounter: Secondary | ICD-10-CM | POA: Diagnosis not present

## 2016-09-14 DIAGNOSIS — M25562 Pain in left knee: Secondary | ICD-10-CM | POA: Diagnosis not present

## 2016-09-21 DIAGNOSIS — M238X2 Other internal derangements of left knee: Secondary | ICD-10-CM | POA: Diagnosis not present

## 2016-09-24 DIAGNOSIS — D869 Sarcoidosis, unspecified: Secondary | ICD-10-CM | POA: Diagnosis not present

## 2016-09-24 DIAGNOSIS — R42 Dizziness and giddiness: Secondary | ICD-10-CM | POA: Diagnosis not present

## 2016-09-24 DIAGNOSIS — I351 Nonrheumatic aortic (valve) insufficiency: Secondary | ICD-10-CM | POA: Diagnosis not present

## 2016-09-26 DIAGNOSIS — M238X2 Other internal derangements of left knee: Secondary | ICD-10-CM | POA: Diagnosis not present

## 2016-09-30 DIAGNOSIS — S83282A Other tear of lateral meniscus, current injury, left knee, initial encounter: Secondary | ICD-10-CM | POA: Diagnosis not present

## 2016-09-30 DIAGNOSIS — S83242A Other tear of medial meniscus, current injury, left knee, initial encounter: Secondary | ICD-10-CM | POA: Diagnosis not present

## 2016-09-30 DIAGNOSIS — S82192A Other fracture of upper end of left tibia, initial encounter for closed fracture: Secondary | ICD-10-CM | POA: Diagnosis not present

## 2016-10-08 DIAGNOSIS — I351 Nonrheumatic aortic (valve) insufficiency: Secondary | ICD-10-CM | POA: Diagnosis not present

## 2016-10-13 DIAGNOSIS — M11262 Other chondrocalcinosis, left knee: Secondary | ICD-10-CM | POA: Diagnosis not present

## 2016-10-13 DIAGNOSIS — G8918 Other acute postprocedural pain: Secondary | ICD-10-CM | POA: Diagnosis not present

## 2016-10-13 DIAGNOSIS — S83242A Other tear of medial meniscus, current injury, left knee, initial encounter: Secondary | ICD-10-CM | POA: Diagnosis not present

## 2016-10-13 DIAGNOSIS — S83282A Other tear of lateral meniscus, current injury, left knee, initial encounter: Secondary | ICD-10-CM | POA: Diagnosis not present

## 2016-10-13 DIAGNOSIS — S83272A Complex tear of lateral meniscus, current injury, left knee, initial encounter: Secondary | ICD-10-CM | POA: Diagnosis not present

## 2016-10-20 DIAGNOSIS — M25662 Stiffness of left knee, not elsewhere classified: Secondary | ICD-10-CM | POA: Diagnosis not present

## 2016-11-18 DIAGNOSIS — S83242D Other tear of medial meniscus, current injury, left knee, subsequent encounter: Secondary | ICD-10-CM | POA: Diagnosis not present

## 2016-12-17 DIAGNOSIS — M6281 Muscle weakness (generalized): Secondary | ICD-10-CM | POA: Diagnosis not present

## 2017-01-04 DIAGNOSIS — M79652 Pain in left thigh: Secondary | ICD-10-CM | POA: Diagnosis not present

## 2017-01-08 DIAGNOSIS — S83242D Other tear of medial meniscus, current injury, left knee, subsequent encounter: Secondary | ICD-10-CM | POA: Diagnosis not present

## 2017-01-08 DIAGNOSIS — M1712 Unilateral primary osteoarthritis, left knee: Secondary | ICD-10-CM | POA: Diagnosis not present

## 2017-06-22 ENCOUNTER — Other Ambulatory Visit: Payer: Self-pay | Admitting: Internal Medicine

## 2017-06-22 DIAGNOSIS — E785 Hyperlipidemia, unspecified: Secondary | ICD-10-CM

## 2017-07-08 ENCOUNTER — Ambulatory Visit: Payer: BLUE CROSS/BLUE SHIELD

## 2017-07-22 ENCOUNTER — Ambulatory Visit: Payer: BLUE CROSS/BLUE SHIELD

## 2018-05-09 ENCOUNTER — Other Ambulatory Visit: Payer: Self-pay | Admitting: Pulmonary Disease

## 2018-05-09 DIAGNOSIS — D869 Sarcoidosis, unspecified: Secondary | ICD-10-CM

## 2018-05-13 ENCOUNTER — Ambulatory Visit
Admission: RE | Admit: 2018-05-13 | Discharge: 2018-05-13 | Disposition: A | Discharge status: Home / Self Care | Payer: BLUE CROSS/BLUE SHIELD | Source: Ambulatory Visit | Attending: Pulmonary Disease | Admitting: Pulmonary Disease

## 2018-05-13 DIAGNOSIS — D869 Sarcoidosis, unspecified: Secondary | ICD-10-CM

## 2019-01-27 ENCOUNTER — Other Ambulatory Visit: Payer: Self-pay

## 2019-01-27 ENCOUNTER — Encounter: Payer: Self-pay | Admitting: Internal Medicine

## 2019-01-27 DIAGNOSIS — Z20822 Contact with and (suspected) exposure to covid-19: Secondary | ICD-10-CM

## 2019-01-29 LAB — NOVEL CORONAVIRUS, NAA: SARS-CoV-2, NAA: NOT DETECTED

## 2019-03-14 ENCOUNTER — Encounter: Payer: Self-pay | Admitting: Internal Medicine

## 2019-03-14 ENCOUNTER — Ambulatory Visit: Payer: Managed Care, Other (non HMO) | Admitting: Internal Medicine

## 2019-03-14 VITALS — BP 130/82 | HR 68 | Temp 97.8°F | Ht 68.0 in | Wt 125.0 lb

## 2019-03-14 DIAGNOSIS — E8801 Alpha-1-antitrypsin deficiency: Secondary | ICD-10-CM | POA: Diagnosis not present

## 2019-03-14 DIAGNOSIS — R1013 Epigastric pain: Secondary | ICD-10-CM | POA: Insufficient documentation

## 2019-03-14 DIAGNOSIS — R6881 Early satiety: Secondary | ICD-10-CM | POA: Insufficient documentation

## 2019-03-14 DIAGNOSIS — Z1211 Encounter for screening for malignant neoplasm of colon: Secondary | ICD-10-CM | POA: Insufficient documentation

## 2019-03-14 DIAGNOSIS — R1909 Other intra-abdominal and pelvic swelling, mass and lump: Secondary | ICD-10-CM | POA: Diagnosis not present

## 2019-03-14 DIAGNOSIS — Z1159 Encounter for screening for other viral diseases: Secondary | ICD-10-CM

## 2019-03-14 HISTORY — DX: Alpha-1-antitrypsin deficiency: E88.01

## 2019-03-14 NOTE — Progress Notes (Signed)
Stacy Matthews 63 y.o. 03-25-1956 [id number] Referred by: Rusty Aus, MD  Assessment & Plan:  Abdominal pain, epigastric Evaluate with EGD.The risks and benefits as well as alternatives of endoscopic procedure(s) have been discussed and reviewed. All questions answered. The patient agrees to proceed.   Early satiety Evaluate with EGD.  Could have gastritis.  Functional disturbance possible.  Malignancy possible but seems unlikely.  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  We elected to observe.  She prefers not to have a CT scan due to radiation risk and based upon history and exam I think it is fine just to observe.  The next step could be referral to a surgeon for further evaluation.  Colon cancer screening Screening colonoscopy will be scheduled at the patient's request.The risks and benefits as well as alternatives of endoscopic procedure(s) have been discussed and reviewed. All questions answered. The patient agrees to proceed.    I appreciate the opportunity to care for this patient. CC: Rusty Aus, MD    Subjective:   Chief Complaint: Abdominal pain swelling in lower groin area HPI Stacy Matthews is a 63 year old white woman known to me from previous colonoscopy 11 years ago, who has been having several months or more of early satiety and epigastric pain.  It comes and goes.  She fills up fast though the pain is not necessarily related to eating.  She was losing some weight when she saw Dr. Sabra Heck in the summer.  Overall as 1 can see below she is not far off from where she was in 2012 or 2016.  She had a screening colonoscopy by me that was negative she is not having any colon symptoms.  The pain is not disturbing her sleep there are no clear triggers as mentioned.  She takes digestive enzymes regularly with meals which she has been  doing for some time she is not sure who recommended this but she definitely has less bloating or gas if she takes that.  She has alpha 1 antitrypsin deficiency which has affected her lungs but no other organs that we know of.  There is also a history of sarcoidosis.  EGD in 2016 was essentially unrevealing she was having some oropharyngeal type dysphagia problems then.  Irregular Z-line in the past and multiple biopsies have not shown intestinal metaplasia and she does not have any columnar tongues greater than a centimeter.  She also notices bilateral bulging in the groin area.  It seems to reduce spontaneously its not tender necessarily but there is sort of a burning pain that she can feel there.  She has not had this evaluated.  It has not shown up on previous imaging notes been a long time since she had any CT scanning.  She had an inverted appendiceal orifice when I had scoped her years ago and because of that and some calcifications thought to be in her appendix she had an appendectomy by Dr. Marlou Starks in 2011.  Chronic appendicitis seen.  GI review of systems is otherwise negative.  She has not tried any over-the-counter agents for her GI symptoms.  Wt Readings from Last 3 Encounters:  03/14/19 125 lb (56.7 kg)  03/25/15 127 lb (57.6 kg)  12/09/10 127 lb 9.6 oz (57.9 kg)    EGD 2016 [facility] - Z-line irregular, 40 cm from the incisors.  Biopsied. - Gastritis. - Normal examined duodenum. - Biopsies were taken with a cold forceps for histology in the middle third of the esophagus.  No Known Allergies Current Meds  Medication Sig   Alpha-Lipoic Acid 600 MG CAPS Take 1 capsule by mouth daily.   DIGESTIVE ENZYMES PO Take 1 capsule by mouth 4 (four) times daily -  before meals and at bedtime.   Krill Oil 500 MG CAPS Take 1 capsule by mouth daily.   levothyroxine (SYNTHROID) 75 MCG tablet Take 1 tablet by mouth daily.   magnesium oxide (MAG-OX) 400 MG tablet Take 400 mg by mouth daily.    Probiotic Product (PROBIOTIC-10 PO) Take 1 capsule by mouth daily.   Turmeric (QC TUMERIC COMPLEX) 500 MG CAPS Take 1 capsule by mouth daily.   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 03/14/2019   HLD (hyperlipidemia)    Hyperlipidemia    Hypothyroidism    Internal hemorrhoids    Osteoarthritis    Pulmonary sarcoidosis (HCC)    nonactive, biopsy proven    Raynaud's phenomenon    Squamous cell cancer of lip    of skin; not lip    Past Surgical History:  Procedure Laterality Date   APPENDECTOMY  09-20-09   chronic appendicitis   COLONOSCOPY W/ BIOPSIES  04/01/2009   internal hemorrhoids, non-specific inflammation near appendix   ESOPHAGOGASTRODUODENOSCOPY (EGD) WITH PROPOFOL N/A 03/25/2015   Procedure: ESOPHAGOGASTRODUODENOSCOPY (EGD) WITH PROPOFOL;  Surgeon: Josefine Class, MD;  Location: [facility] ENDOSCOPY;  Service: Endoscopy;  Laterality: N/A;   MEDIASTINOSCOPY     dx of sarcoid    OOPHORECTOMY  2008   L   removal of mucoceles  2007   TONSILLECTOMY     TONSILLECTOMY AND ADENOIDECTOMY     UPPER GASTROINTESTINAL ENDOSCOPY  06/2004, 05/2006   Barrett's on 2006 EGd but not seen/confirmed 2008   US ECHOCARDIOGRAPHY  12/08   mild-mod MR, myxomatous mitral vlave leaflet, mild-mod AI, normal EF    Social History   Social History Narrative   Married, 3 children.    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 May leave msg on cell   family history includes Atrial fibrillation in her mother; BRCA 1/2 in an other family member; Colon polyps in  her father and mother; Diabetes in her maternal grandmother and paternal grandmother; Healthy in her brother and sister; Heart failure in her mother; Hyperlipidemia in an other family member; Hypertension in an other family member; Lung cancer in her cousin and paternal uncle; Prostate cancer in her father; Rheum arthritis in her mother; Stomach cancer in her paternal grandmother.   Review of Systems TMJ syndrome developing, some cough some stress urinary incontinence she has had some swollen right cervical nodes and arthritis symptoms.  Skin itching.  All other review of systems negative  Objective:   Physical Exam _0  130/82    Pulse 68    Temp 97.8 F (36.6 C)    Ht 5' 8" (1.727 m)    Wt 125 lb (56.7 kg)  BMI 19.01 kg/m @  General:  Well-developed, well-nourished and in no acute distress Eyes:  anicteric. ENT:   Mouth and posterior pharynx free of lesions.  Neck:   supple w/o thyromegaly or mass.  Lungs: Clear to auscultation bilaterally. Heart:  S1S2, no rubs, murmurs, gallops. Abdomen:  soft, non-tender, no hepatosplenomegaly, hernia, or mass and BS+.  Slight bulge in groins with cough when standing but not clear hernia Rectal: Is deferred until colonoscopy Lymph:  no cervical or supraclavicular adenopathy. Extremities:   no edema, cyanosis or clubbing Skin   no rash. Neuro:  A&O x 3.  Psych:  appropriate mood and  Affect.   Data Reviewed: See HPI.  I reviewed labs that she has had done this year as well.  Primary care notes this year also.  These are reflected in the EMR through care everywhere.  She is not anemic.  Her LFTs are normal and have been.  No liver abnormalities on imaging.

## 2019-03-14 NOTE — Assessment & Plan Note (Signed)
I do not detect any hernias however that sounds possible.  We elected to observe.  She prefers not to have a CT scan due to radiation risk and based upon history and exam I think it is fine just to observe.  The next step could be referral to a surgeon for further evaluation.

## 2019-03-14 NOTE — Assessment & Plan Note (Signed)
Screening colonoscopy will be scheduled at the patient's request.The risks and benefits as well as alternatives of endoscopic procedure(s) have been discussed and reviewed. All questions answered. The patient agrees to proceed.

## 2019-03-14 NOTE — Assessment & Plan Note (Addendum)
Evaluate with EGD.The risks and benefits as well as alternatives of endoscopic procedure(s) have been discussed and reviewed. All questions answered. The patient agrees to proceed.

## 2019-03-14 NOTE — Assessment & Plan Note (Signed)
Liver does not seem to be affected

## 2019-03-14 NOTE — Assessment & Plan Note (Signed)
Evaluate with EGD.  Could have gastritis.  Functional disturbance possible.  Malignancy possible but seems unlikely.

## 2019-03-24 ENCOUNTER — Other Ambulatory Visit: Payer: Self-pay

## 2019-03-24 DIAGNOSIS — Z20822 Contact with and (suspected) exposure to covid-19: Secondary | ICD-10-CM

## 2019-03-25 LAB — NOVEL CORONAVIRUS, NAA: SARS-CoV-2, NAA: NOT DETECTED

## 2019-04-28 ENCOUNTER — Encounter: Payer: Managed Care, Other (non HMO) | Admitting: Internal Medicine

## 2019-05-01 ENCOUNTER — Encounter: Payer: Self-pay | Admitting: Internal Medicine

## 2019-06-08 ENCOUNTER — Encounter: Payer: Self-pay | Admitting: Internal Medicine

## 2019-07-13 DIAGNOSIS — Z860101 Personal history of adenomatous and serrated colon polyps: Secondary | ICD-10-CM

## 2019-07-13 DIAGNOSIS — Z86010 Personal history of colonic polyps: Secondary | ICD-10-CM

## 2019-07-13 HISTORY — DX: Personal history of adenomatous and serrated colon polyps: Z86.0101

## 2019-07-13 HISTORY — DX: Personal history of colonic polyps: Z86.010

## 2019-07-28 ENCOUNTER — Ambulatory Visit (AMBULATORY_SURGERY_CENTER): Payer: Self-pay | Admitting: *Deleted

## 2019-07-28 ENCOUNTER — Other Ambulatory Visit: Payer: Self-pay

## 2019-07-28 ENCOUNTER — Telehealth: Payer: Self-pay | Admitting: *Deleted

## 2019-07-28 VITALS — Temp 96.4°F | Ht 68.0 in | Wt 124.0 lb

## 2019-07-28 DIAGNOSIS — Z01818 Encounter for other preprocedural examination: Secondary | ICD-10-CM

## 2019-07-28 DIAGNOSIS — R1013 Epigastric pain: Secondary | ICD-10-CM

## 2019-07-28 DIAGNOSIS — Z1211 Encounter for screening for malignant neoplasm of colon: Secondary | ICD-10-CM

## 2019-07-28 DIAGNOSIS — R6881 Early satiety: Secondary | ICD-10-CM

## 2019-07-28 NOTE — Telephone Encounter (Signed)
OK to proceed

## 2019-07-28 NOTE — Progress Notes (Signed)
Stacy Matthews is here in-person for PV. Stacy Matthews denies any allergies to eggs, Stacy Matthews has stomach pains with soy. Stacy Matthews denies any problems with anesthesia/sedation. Stacy Matthews denies any oxygen use at home. Stacy Matthews denies taking any diet/weight loss medications or blood thinners. Stacy Matthews is not being treated for MRSA or C-diff. EMMI education assisgned to the Stacy Matthews for the procedure, this was explained and instructions given to Stacy Matthews. COVID-19 screening test is on 08/09/2019, the pt is aware.  Stacy Matthews is aware of our care-partner policy and 0000000 safety protocol.

## 2019-07-28 NOTE — Telephone Encounter (Signed)
Noted  

## 2019-07-28 NOTE — Telephone Encounter (Signed)
Dr.Gessner,   Patient had her PV today for her ECL with you on 08/11/2019. Her last OV was on 03/14/2019 at that you said for her to have the [facility] for screening colon, epi. Abd. Pain, early satiety. Patient but this off due to covid but now wants to proceed. Due to our new protocol is it okay to proceed with the ECL since it's been over 3 months since OV or does she need an OV first?  Thanks, Phong Isenberg pv

## 2019-08-09 ENCOUNTER — Encounter: Payer: Self-pay | Admitting: Internal Medicine

## 2019-08-09 ENCOUNTER — Other Ambulatory Visit: Payer: Self-pay

## 2019-08-09 ENCOUNTER — Other Ambulatory Visit
Admission: RE | Admit: 2019-08-09 | Discharge: 2019-08-09 | Disposition: A | Discharge status: Home / Self Care | Payer: 59 | Source: Ambulatory Visit | Attending: Internal Medicine | Admitting: Internal Medicine

## 2019-08-09 DIAGNOSIS — Z20822 Contact with and (suspected) exposure to covid-19: Secondary | ICD-10-CM | POA: Diagnosis not present

## 2019-08-09 DIAGNOSIS — Z01812 Encounter for preprocedural laboratory examination: Secondary | ICD-10-CM | POA: Insufficient documentation

## 2019-08-09 LAB — SARS CORONAVIRUS 2 (TAT 6-24 HRS): SARS Coronavirus 2: NEGATIVE

## 2019-08-11 ENCOUNTER — Ambulatory Visit (AMBULATORY_SURGERY_CENTER): Payer: 59 | Admitting: Internal Medicine

## 2019-08-11 ENCOUNTER — Encounter: Payer: Self-pay | Admitting: Internal Medicine

## 2019-08-11 ENCOUNTER — Other Ambulatory Visit: Payer: Self-pay

## 2019-08-11 VITALS — BP 127/77 | HR 50 | Temp 95.1°F | Resp 12 | Ht 68.0 in | Wt 124.0 lb

## 2019-08-11 DIAGNOSIS — R1013 Epigastric pain: Secondary | ICD-10-CM | POA: Diagnosis not present

## 2019-08-11 DIAGNOSIS — D120 Benign neoplasm of cecum: Secondary | ICD-10-CM

## 2019-08-11 DIAGNOSIS — Z1211 Encounter for screening for malignant neoplasm of colon: Secondary | ICD-10-CM | POA: Diagnosis present

## 2019-08-11 DIAGNOSIS — D123 Benign neoplasm of transverse colon: Secondary | ICD-10-CM

## 2019-08-11 DIAGNOSIS — D125 Benign neoplasm of sigmoid colon: Secondary | ICD-10-CM

## 2019-08-11 MED ORDER — SODIUM CHLORIDE 0.9 % IV SOLN: 500.0000 mL | Freq: Once | INTRAVENOUS | Status: DC

## 2019-08-11 NOTE — Op Note (Signed)
[place] Patient Name: Stacy Matthews Procedure Date: 08/11/2019 8:34 AM MRN: [id number] Endoscopist: Gatha Mayer , MD Age: 64 Referring MD:  Date of Birth: Nov 23, 1955 Gender: Female Account #: [id number] Procedure:                Upper GI endoscopy Indications:              Epigastric abdominal pain Medicines:                Propofol per Anesthesia, Monitored Anesthesia Care Procedure:                Pre-Anesthesia Assessment:                           - Prior to the procedure, a History and Physical                            was performed, and patient medications and                            allergies were reviewed. The patient's tolerance of                            previous anesthesia was also reviewed. The risks                            and benefits of the procedure and the sedation                            options and risks were discussed with the patient.                            All questions were answered, and informed consent                            was obtained. Prior Anticoagulants: The patient has                            taken no previous anticoagulant or antiplatelet                            agents. ASA Grade Assessment: II - A patient with                            mild systemic disease. After reviewing the risks                            and benefits, the patient was deemed in                            satisfactory condition to undergo the procedure.                           After obtaining informed consent, the endoscope was  passed under direct vision. Throughout the                            procedure, the patient's blood pressure, pulse, and                            oxygen saturations were monitored continuously. The                            Endoscope was introduced through the mouth, and                            advanced to the second part of duodenum. The upper                            GI  endoscopy was accomplished without difficulty.                            The patient tolerated the procedure well. Scope In: Scope Out: Findings:                 Patchy mildly erythematous mucosa without bleeding                            was found in the gastric antrum. Biopsies were                            taken with a cold forceps for Helicobacter pylori                            testing using CLOtest. Verification of patient                            identification for the specimen was done. Estimated                            blood loss was minimal.                           The exam was otherwise without abnormality.                           The cardia and gastric fundus were normal on                            retroflexion. Complications:            No immediate complications. Estimated Blood Loss:     Estimated blood loss was minimal. Impression:               - Erythematous mucosa in the antrum. Biopsied.                           - The examination was otherwise normal. Recommendation:           - Patient has a contact number available for  emergencies. The signs and symptoms of potential                            delayed complications were discussed with the                            patient. Return to normal activities tomorrow.                            Written discharge instructions were provided to the                            patient.                           - Resume previous diet.                           - Continue present medications.                           - See the other procedure note for documentation of                            additional recommendations. Gatha Mayer, MD 08/11/2019 9:23:35 AM This report has been signed electronically.

## 2019-08-11 NOTE — Patient Instructions (Addendum)
I took stomach biopsies to see if you have an infection called H. Pylori.  I removed 3 very tiny polyps.  Once I see results will let you know plans.  I appreciate the opportunity to care for you. Gatha Mayer, MD, [facility]   Handout given for polyps.  YOU HAD AN ENDOSCOPIC PROCEDURE TODAY AT [facility]:   Refer to the procedure report that was given to you for any specific questions about what was found during the examination.  If the procedure report does not answer your questions, please call your gastroenterologist to clarify.  If you requested that your care partner not be given the details of your procedure findings, then the procedure report has been included in a sealed envelope for you to review at your convenience later.  YOU SHOULD EXPECT: Some feelings of bloating in the abdomen. Passage of more gas than usual.  Walking can help get rid of the air that was put into your GI tract during the procedure and reduce the bloating. If you had a lower endoscopy (such as a colonoscopy or flexible sigmoidoscopy) you may notice spotting of blood in your stool or on the toilet paper. If you underwent a bowel prep for your procedure, you may not have a normal bowel movement for a few days.  Please Note:  You might notice some irritation and congestion in your nose or some drainage.  This is from the oxygen used during your procedure.  There is no need for concern and it should clear up in a day or so.  SYMPTOMS TO REPORT IMMEDIATELY:   Following lower endoscopy (colonoscopy or flexible sigmoidoscopy):  Excessive amounts of blood in the stool  Significant tenderness or worsening of abdominal pains  Swelling of the abdomen that is new, acute  Fever of 100F or higher   Following upper endoscopy (EGD)  Vomiting of blood or coffee ground material  New chest pain or pain under the shoulder blades  Painful or persistently difficult swallowing  New shortness of breath  Fever  of 100F or higher  Black, tarry-looking stools  For urgent or emergent issues, a gastroenterologist can be reached at any hour by calling [phone]. Do not use MyChart messaging for urgent concerns.    DIET:  We do recommend a small meal at first, but then you may proceed to your regular diet.  Drink plenty of fluids but you should avoid alcoholic beverages for 24 hours.  ACTIVITY:  You should plan to take it easy for the rest of today and you should NOT DRIVE or use heavy machinery until tomorrow (because of the sedation medicines used during the test).    FOLLOW UP: Our staff will call the number listed on your records 48-72 hours following your procedure to check on you and address any questions or concerns that you may have regarding the information given to you following your procedure. If we do not reach you, we will leave a message.  We will attempt to reach you two times.  During this call, we will ask if you have developed any symptoms of COVID 19. If you develop any symptoms (ie: fever, flu-like symptoms, shortness of breath, cough etc.) before then, please call [phone].  If you test positive for Covid 19 in the 2 weeks post procedure, please call and report this information to [place].    If any biopsies were taken you will be contacted by phone or by letter within the next 1-3 weeks.  Please call us at [phone] if you have not heard about the biopsies in 3 weeks.    SIGNATURES/CONFIDENTIALITY: You and/or your care partner have signed paperwork which will be entered into your electronic medical record.  These signatures attest to the fact that that the information above on your After Visit Summary has been reviewed and is understood.  Full responsibility of the confidentiality of this discharge information lies with you and/or your care-partner.

## 2019-08-11 NOTE — Progress Notes (Signed)
Pt. 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

## 2019-08-11 NOTE — Progress Notes (Signed)
Called to room to assist during endoscopic procedure.  Patient ID and intended procedure confirmed with present staff. Received instructions for my participation in the procedure from the performing physician.  

## 2019-08-11 NOTE — Progress Notes (Signed)
pt tolerated well. VSS. awake and to recovery. Report given to RN. Oral bite block inserted and removed with ease. Atraumatic. 

## 2019-08-11 NOTE — Progress Notes (Signed)
Vitals-DT Temp-LC  Pt's states no medical or surgical changes since previsit or office visit.

## 2019-08-11 NOTE — Op Note (Signed)
[facility] Patient Name: Stacy Matthews Procedure Date: 08/11/2019 8:33 AM MRN: [id number] Endoscopist: Gatha Mayer , MD Age: 64 Referring MD:  Date of Birth: 02-24-1956 Gender: Female Account #: [id number] Procedure:                Colonoscopy Indications:              Screening for colorectal malignant neoplasm Medicines:                Propofol per Anesthesia Procedure:                Pre-Anesthesia Assessment:                           - Prior to the procedure, a History and Physical                            was performed, and patient medications and                            allergies were reviewed. The patient's tolerance of                            previous anesthesia was also reviewed. The risks                            and benefits of the procedure and the sedation                            options and risks were discussed with the patient.                            All questions were answered, and informed consent                            was obtained. Prior Anticoagulants: The patient has                            taken no previous anticoagulant or antiplatelet                            agents. ASA Grade Assessment: II - A patient with                            mild systemic disease. After reviewing the risks                            and benefits, the patient was deemed in                            satisfactory condition to undergo the procedure.                           After obtaining informed consent, the colonoscope  was passed under direct vision. Throughout the                            procedure, the patient's blood pressure, pulse, and                            oxygen saturations were monitored continuously. The                            Colonoscope was introduced through the anus and                            advanced to the the cecum, identified by                            appendiceal orifice and  ileocecal valve. The                            colonoscopy was performed without difficulty. The                            patient tolerated the procedure well. The quality                            of the bowel preparation was good. The ileocecal                            valve, appendiceal orifice, and rectum were                            photographed. The bowel preparation used was                            Miralax via split dose instruction. Scope In: 8:51:28 AM Scope Out: 9:12:32 AM Scope Withdrawal Time: 0 hours 15 minutes 26 seconds  Total Procedure Duration: 0 hours 21 minutes 4 seconds  Findings:                 The digital rectal exam findings include decreased                            sphincter tone.                           A 3 mm polyp was found in the sigmoid colon. The                            polyp was sessile. The polyp was removed with a                            cold snare. Resection and retrieval were complete.                            Verification of patient identification for the  specimen was done. Estimated blood loss was minimal.                           Two sessile polyps were found in the transverse                            colon and ileocecal valve. The polyps were 1 mm in                            size. These polyps were removed with a cold biopsy                            forceps. Resection and retrieval were complete.                            Verification of patient identification for the                            specimen was done. Estimated blood loss was minimal.                           The exam was otherwise without abnormality on                            direct and retroflexion views. Complications:            No immediate complications. Estimated Blood Loss:     Estimated blood loss was minimal. Impression:               - Decreased sphincter tone found on digital rectal                             exam.                           - One 3 mm polyp in the sigmoid colon, removed with                            a cold snare. Resected and retrieved.                           - Two 1 mm polyps in the transverse colon and at                            the ileocecal valve, removed with a cold biopsy                            forceps. Resected and retrieved.                           - The examination was otherwise normal on direct                            and retroflexion views. Recommendation:           -  Patient has a contact number available for                            emergencies. The signs and symptoms of potential                            delayed complications were discussed with the                            patient. Return to normal activities tomorrow.                            Written discharge instructions were provided to the                            patient.                           - Resume previous diet.                           - Continue present medications.                           - Repeat colonoscopy is recommended. The                            colonoscopy date will be determined after pathology                            results from today's exam become available for                            review. Gatha Mayer, MD 08/11/2019 9:27:16 AM This report has been signed electronically.

## 2019-08-14 LAB — HELICOBACTER PYLORI SCREEN-BIOPSY: UREASE: NEGATIVE

## 2019-08-15 ENCOUNTER — Telehealth: Payer: Self-pay | Admitting: *Deleted

## 2019-08-15 ENCOUNTER — Telehealth: Payer: Self-pay

## 2019-08-15 ENCOUNTER — Encounter: Payer: Self-pay | Admitting: Internal Medicine

## 2019-08-15 NOTE — Telephone Encounter (Signed)
Message left

## 2019-08-15 NOTE — Telephone Encounter (Signed)
LVM

## 2020-12-17 IMAGING — CT CT CHEST W/O CM
2 of 6 series · 11 of 36 positions shown, 13 images · non-contrast
Comparison: CT of the chest 04/17/2008

CLINICAL DATA: Sarcoidosis.  Increased cough.

EXAM:
CT CHEST WITHOUT CONTRAST
TECHNIQUE: Multidetector CT imaging of the chest was performed following the
standard protocol without IV contrast.

[Series 2: chest 2.00 br40 s3 ax · axial · 0.45mm/px · z∈[+1633,+1913]mm · 8 of 176 slices shown, 10 images]
[im 18/176  mediastinal]
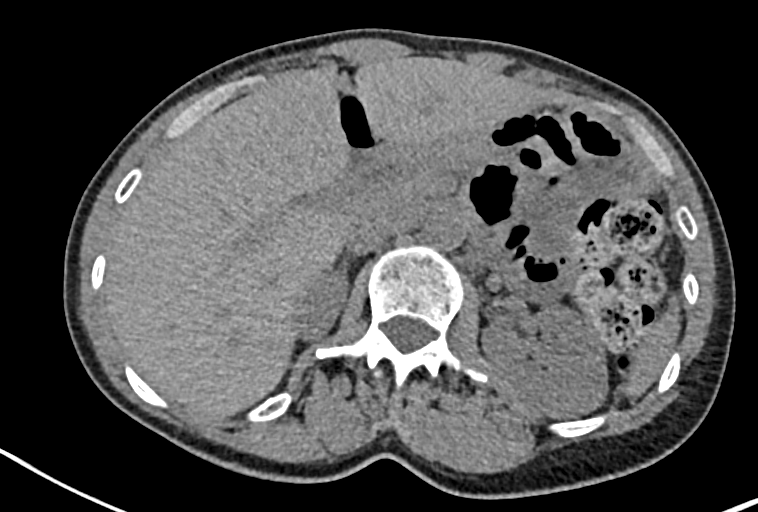
[im 18/176  lung]
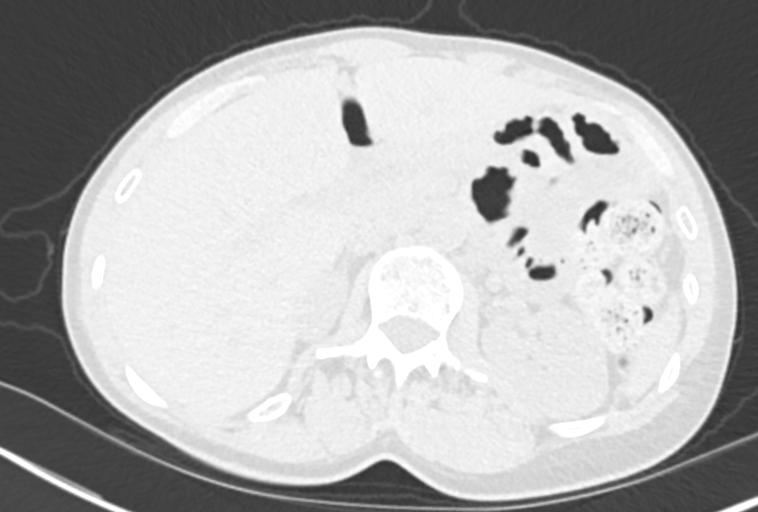
[im 36/176  lung]
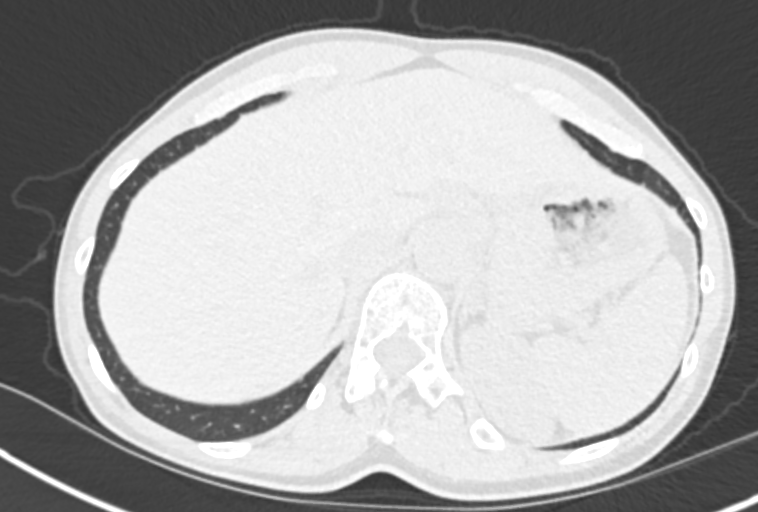
[im 53/176  lung]
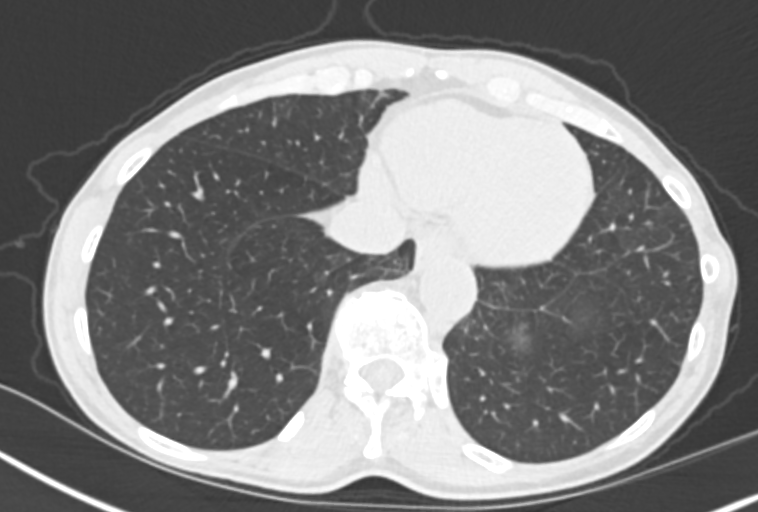
[im 71/176  lung]
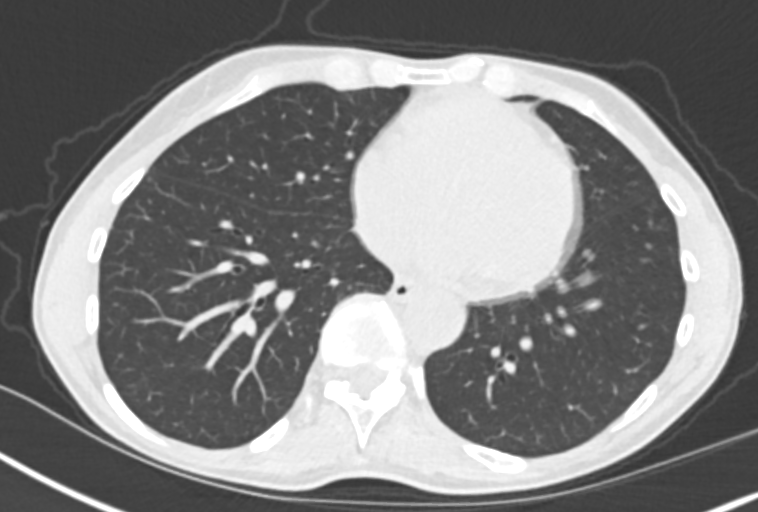
[im 106/176  mediastinal]
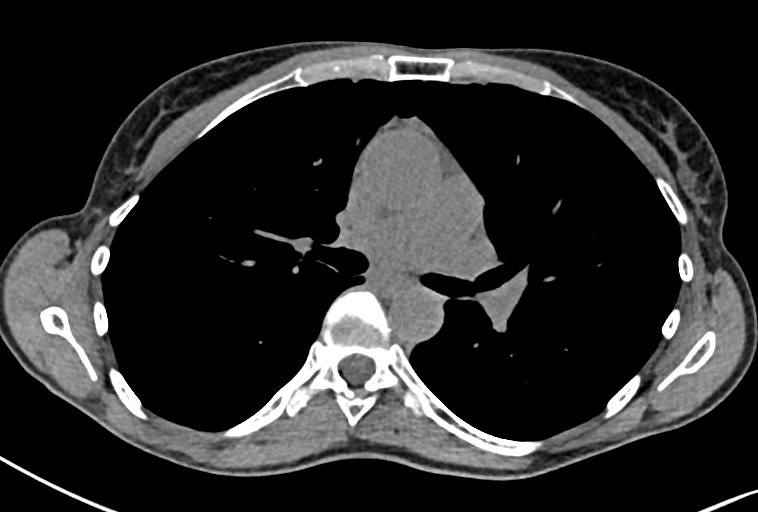
[im 106/176  lung]
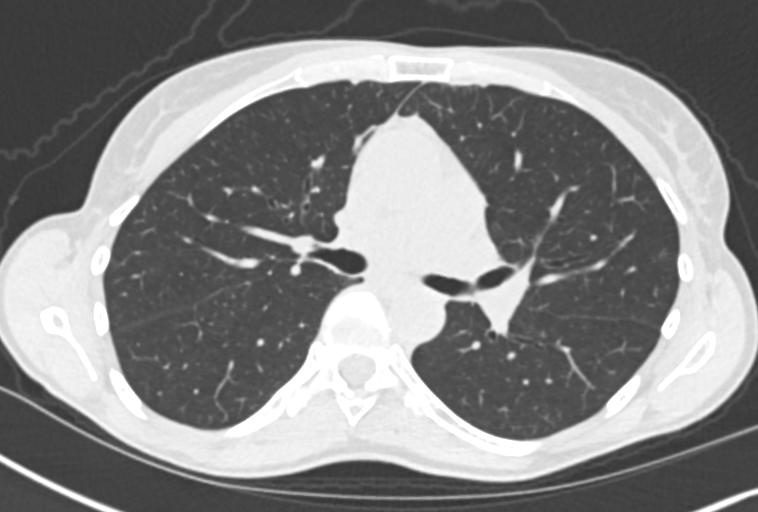
[im 123/176  lung]
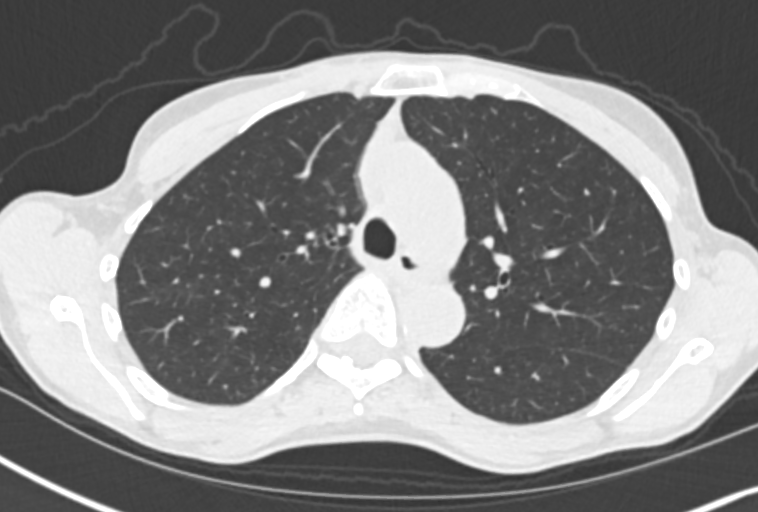
[im 141/176  lung]
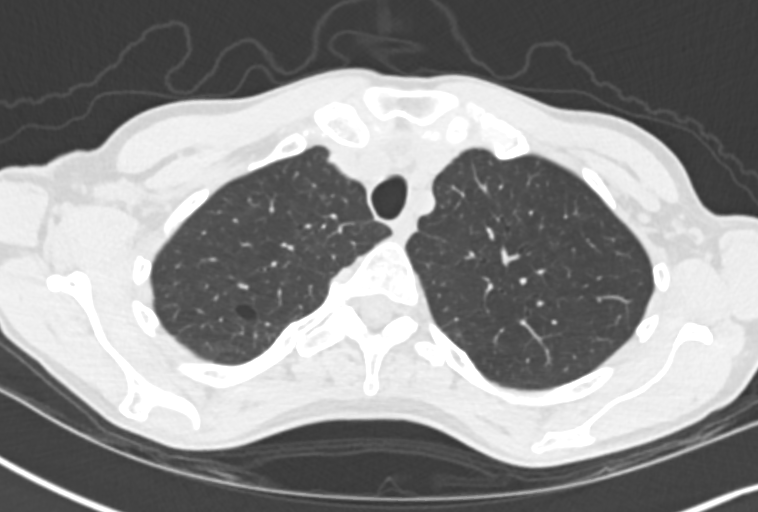
[im 158/176  lung]
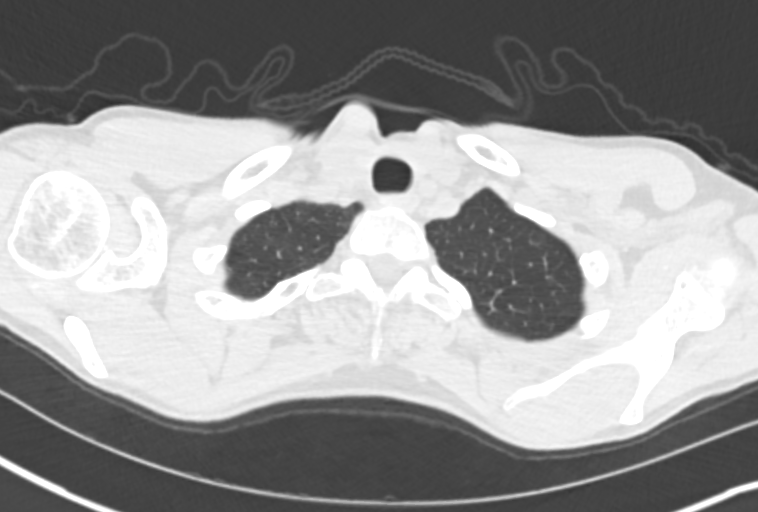

[Series 4: chest 2.00 br40 s3 cor · coronal · 0.67mm/px · 3 of 115 slices shown]
[im 23/115  lung]
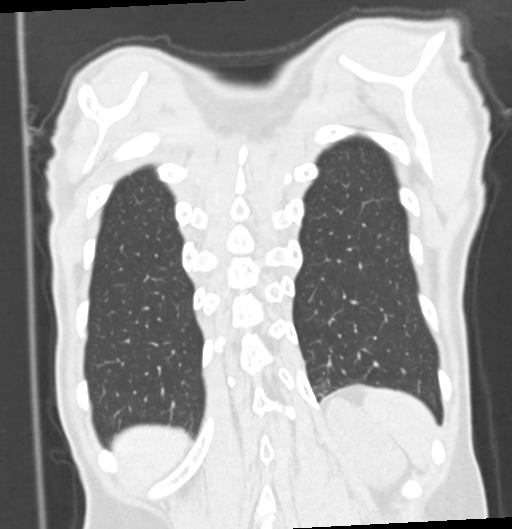
[im 46/115  lung]
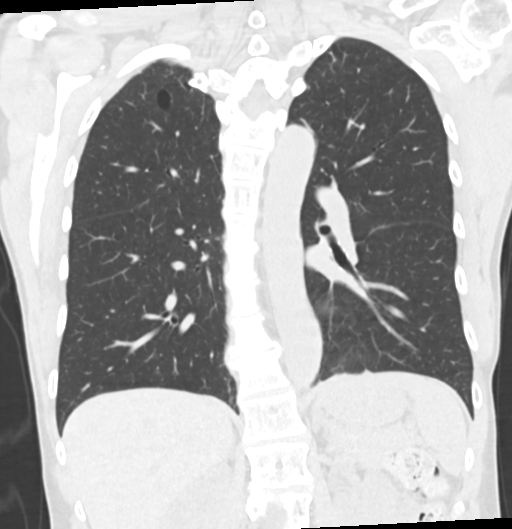
[im 69/115  lung]
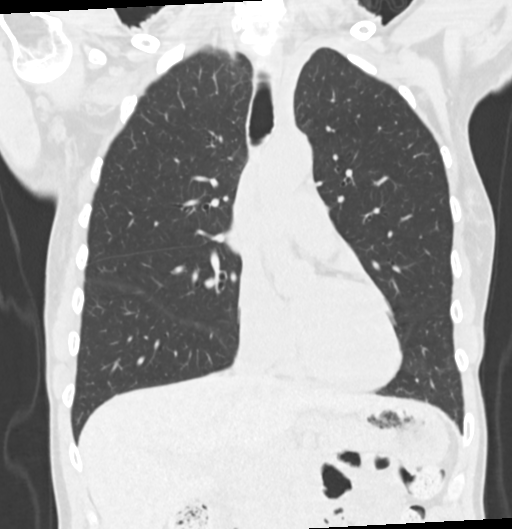

[11 of 36 positions shown; findings below may reference images not displayed]

FINDINGS: Cardiovascular: The heart size is normal. Minimal atherosclerotic
calcifications are present at the aortic arch. Aorta is otherwise
within limits. Pulmonary arteries are unremarkable. No significant
pericardial effusion is present.

Mediastinum/Nodes: No significant mediastinal, hilar, or axillary
adenopathy is present on this noncontrast study.

Lungs/Pleura: The lungs are clear. There is no focal nodule, mass,
or airspace disease. No pleural effusion is present.

Upper Abdomen: Limited imaging the abdomen is unremarkable. There is
no significant adenopathy. No solid organ lesions are present.

Musculoskeletal: Endplate degenerative changes are noted anteriorly
at T11-12. There is rightward curvature of the thoracic spine,
centered at T9. No focal lytic or blastic lesions are present.
IMPRESSION: 1. Normal noncontrast CT appearance of the mediastinum and lungs. No
sequelae of sarcoidosis. No acute or focal lesion to explain
